# Patient Record
Sex: Female | Born: 1935 | Race: Black or African American | Hispanic: No | Marital: Married | Smoking: Never smoker
Health system: Southern US, Community
[De-identification: ages and names within clinical notes are randomized; demographics above are authoritative.]

## PROBLEM LIST (undated history)

## (undated) DIAGNOSIS — R32 Unspecified urinary incontinence: Secondary | ICD-10-CM

## (undated) DIAGNOSIS — G8110 Spastic hemiplegia affecting unspecified side: Secondary | ICD-10-CM

## (undated) DIAGNOSIS — E785 Hyperlipidemia, unspecified: Secondary | ICD-10-CM

## (undated) DIAGNOSIS — R413 Other amnesia: Secondary | ICD-10-CM

## (undated) DIAGNOSIS — M25569 Pain in unspecified knee: Secondary | ICD-10-CM

## (undated) DIAGNOSIS — D62 Acute posthemorrhagic anemia: Secondary | ICD-10-CM

## (undated) DIAGNOSIS — N390 Urinary tract infection, site not specified: Secondary | ICD-10-CM

## (undated) DIAGNOSIS — F0280 Dementia in other diseases classified elsewhere without behavioral disturbance: Secondary | ICD-10-CM

## (undated) DIAGNOSIS — D649 Anemia, unspecified: Secondary | ICD-10-CM

## (undated) DIAGNOSIS — Z9181 History of falling: Secondary | ICD-10-CM

## (undated) DIAGNOSIS — G309 Alzheimer's disease, unspecified: Secondary | ICD-10-CM

## (undated) DIAGNOSIS — I739 Peripheral vascular disease, unspecified: Secondary | ICD-10-CM

## (undated) DIAGNOSIS — I6990 Unspecified sequelae of unspecified cerebrovascular disease: Secondary | ICD-10-CM

## (undated) DIAGNOSIS — S72009B Fracture of unspecified part of neck of unspecified femur, initial encounter for open fracture type I or II: Secondary | ICD-10-CM

## (undated) DIAGNOSIS — I6529 Occlusion and stenosis of unspecified carotid artery: Secondary | ICD-10-CM

## (undated) DIAGNOSIS — M25559 Pain in unspecified hip: Secondary | ICD-10-CM

## (undated) DIAGNOSIS — I639 Cerebral infarction, unspecified: Secondary | ICD-10-CM

## (undated) DIAGNOSIS — I10 Essential (primary) hypertension: Secondary | ICD-10-CM

## (undated) DIAGNOSIS — J301 Allergic rhinitis due to pollen: Secondary | ICD-10-CM

## (undated) DIAGNOSIS — M810 Age-related osteoporosis without current pathological fracture: Secondary | ICD-10-CM

## (undated) DIAGNOSIS — K5900 Constipation, unspecified: Secondary | ICD-10-CM

## (undated) DIAGNOSIS — R410 Disorientation, unspecified: Secondary | ICD-10-CM

## (undated) DIAGNOSIS — K625 Hemorrhage of anus and rectum: Secondary | ICD-10-CM

## (undated) DIAGNOSIS — F0150 Vascular dementia without behavioral disturbance: Secondary | ICD-10-CM

## (undated) DIAGNOSIS — R269 Unspecified abnormalities of gait and mobility: Secondary | ICD-10-CM

## (undated) DIAGNOSIS — IMO0001 Reserved for inherently not codable concepts without codable children: Secondary | ICD-10-CM

## (undated) HISTORY — DX: Unspecified abnormalities of gait and mobility: R26.9

## (undated) HISTORY — DX: Pain in unspecified hip: M25.559

## (undated) HISTORY — DX: Dementia in other diseases classified elsewhere, unspecified severity, without behavioral disturbance, psychotic disturbance, mood disturbance, and anxiety: F02.80

## (undated) HISTORY — DX: Unspecified urinary incontinence: R32

## (undated) HISTORY — DX: Unspecified sequelae of unspecified cerebrovascular disease: I69.90

## (undated) HISTORY — DX: Hemorrhage of anus and rectum: K62.5

## (undated) HISTORY — DX: Fracture of unspecified part of neck of unspecified femur, initial encounter for open fracture type I or II: S72.009B

## (undated) HISTORY — DX: Spastic hemiplegia affecting unspecified side: G81.10

## (undated) HISTORY — DX: Pain in unspecified knee: M25.569

## (undated) HISTORY — DX: Age-related osteoporosis without current pathological fracture: M81.0

## (undated) HISTORY — DX: Constipation, unspecified: K59.00

## (undated) HISTORY — DX: Other amnesia: R41.3

## (undated) HISTORY — DX: Occlusion and stenosis of unspecified carotid artery: I65.29

## (undated) HISTORY — DX: Peripheral vascular disease, unspecified: I73.9

## (undated) HISTORY — PX: PARTIAL HIP ARTHROPLASTY: SHX733

## (undated) HISTORY — DX: Essential (primary) hypertension: I10

## (undated) HISTORY — DX: History of falling: Z91.81

## (undated) HISTORY — DX: Hyperlipidemia, unspecified: E78.5

## (undated) HISTORY — DX: Allergic rhinitis due to pollen: J30.1

## (undated) HISTORY — DX: Reserved for inherently not codable concepts without codable children: IMO0001

## (undated) HISTORY — DX: Vascular dementia, unspecified severity, without behavioral disturbance, psychotic disturbance, mood disturbance, and anxiety: F01.50

## (undated) HISTORY — DX: Acute posthemorrhagic anemia: D62

## (undated) HISTORY — PX: ABDOMINAL HYSTERECTOMY: SHX81

## (undated) HISTORY — DX: Urinary tract infection, site not specified: N39.0

---

## 2008-03-07 LAB — HM DEXA SCAN

## 2008-08-17 ENCOUNTER — Encounter: Admission: RE | Admit: 2008-08-17 | Discharge: 2008-08-17 | Payer: Self-pay | Admitting: Internal Medicine

## 2009-03-16 LAB — HM MAMMOGRAPHY

## 2009-04-30 ENCOUNTER — Emergency Department (HOSPITAL_COMMUNITY): Admission: EM | Admit: 2009-04-30 | Discharge: 2009-04-30 | Payer: Self-pay | Admitting: Emergency Medicine

## 2009-04-30 ENCOUNTER — Emergency Department (HOSPITAL_COMMUNITY): Admission: EM | Admit: 2009-04-30 | Discharge: 2009-04-30 | Payer: Self-pay | Admitting: Family Medicine

## 2009-10-08 ENCOUNTER — Encounter: Admission: RE | Admit: 2009-10-08 | Discharge: 2009-10-08 | Payer: Self-pay | Admitting: Internal Medicine

## 2010-11-11 ENCOUNTER — Ambulatory Visit: Payer: Medicare Other | Attending: Internal Medicine | Admitting: Physical Therapy

## 2010-11-11 DIAGNOSIS — I69998 Other sequelae following unspecified cerebrovascular disease: Secondary | ICD-10-CM | POA: Insufficient documentation

## 2010-11-11 DIAGNOSIS — R262 Difficulty in walking, not elsewhere classified: Secondary | ICD-10-CM | POA: Insufficient documentation

## 2010-11-11 DIAGNOSIS — IMO0001 Reserved for inherently not codable concepts without codable children: Secondary | ICD-10-CM | POA: Insufficient documentation

## 2011-02-07 ENCOUNTER — Emergency Department (HOSPITAL_COMMUNITY): Payer: Medicare Other

## 2011-02-07 ENCOUNTER — Inpatient Hospital Stay (HOSPITAL_COMMUNITY)
Admission: EM | Admit: 2011-02-07 | Discharge: 2011-02-11 | DRG: 470 | Disposition: A | Discharge status: Skilled Nursing Facility | Payer: Medicare Other | Attending: Orthopedic Surgery | Admitting: Orthopedic Surgery

## 2011-02-07 ENCOUNTER — Inpatient Hospital Stay (HOSPITAL_COMMUNITY): Payer: Medicare Other

## 2011-02-07 DIAGNOSIS — Z7982 Long term (current) use of aspirin: Secondary | ICD-10-CM

## 2011-02-07 DIAGNOSIS — W19XXXA Unspecified fall, initial encounter: Secondary | ICD-10-CM | POA: Diagnosis present

## 2011-02-07 DIAGNOSIS — N390 Urinary tract infection, site not specified: Secondary | ICD-10-CM | POA: Diagnosis present

## 2011-02-07 DIAGNOSIS — Z7901 Long term (current) use of anticoagulants: Secondary | ICD-10-CM

## 2011-02-07 DIAGNOSIS — I69959 Hemiplegia and hemiparesis following unspecified cerebrovascular disease affecting unspecified side: Secondary | ICD-10-CM

## 2011-02-07 DIAGNOSIS — Z7902 Long term (current) use of antithrombotics/antiplatelets: Secondary | ICD-10-CM

## 2011-02-07 DIAGNOSIS — A498 Other bacterial infections of unspecified site: Secondary | ICD-10-CM | POA: Diagnosis present

## 2011-02-07 DIAGNOSIS — S72033A Displaced midcervical fracture of unspecified femur, initial encounter for closed fracture: Principal | ICD-10-CM | POA: Diagnosis present

## 2011-02-07 DIAGNOSIS — Y9229 Other specified public building as the place of occurrence of the external cause: Secondary | ICD-10-CM

## 2011-02-07 DIAGNOSIS — E7800 Pure hypercholesterolemia, unspecified: Secondary | ICD-10-CM | POA: Diagnosis present

## 2011-02-07 DIAGNOSIS — Z8249 Family history of ischemic heart disease and other diseases of the circulatory system: Secondary | ICD-10-CM

## 2011-02-07 DIAGNOSIS — I10 Essential (primary) hypertension: Secondary | ICD-10-CM | POA: Diagnosis present

## 2011-02-07 DIAGNOSIS — F0390 Unspecified dementia without behavioral disturbance: Secondary | ICD-10-CM | POA: Diagnosis present

## 2011-02-07 LAB — URINALYSIS, ROUTINE W REFLEX MICROSCOPIC
Ketones, ur: 15 mg/dL — AB
Leukocytes, UA: NEGATIVE

## 2011-02-07 LAB — COMPREHENSIVE METABOLIC PANEL
ALT: 19 U/L (ref 0–35)
Alkaline Phosphatase: 64 U/L (ref 39–117)
BUN: 14 mg/dL (ref 6–23)
BUN: 10 mg/dL (ref 6–23)
Calcium: 9.4 mg/dL (ref 8.4–10.5)
Calcium: 9 mg/dL (ref 8.4–10.5)
Chloride: 107 mEq/L (ref 96–112)
Creatinine, Ser: 0.8 mg/dL (ref 0.50–1.10)
Creatinine, Ser: 0.7 mg/dL (ref 0.50–1.10)
GFR calc Af Amer: >60 mL/min (ref >60)
GFR calc non Af Amer: >60 mL/min (ref >60)
Glucose, Bld: 90 mg/dL (ref 70–99)
Glucose, Bld: 154 mg/dL — ABNORMAL HIGH (ref 70–99)
Sodium: 142 mEq/L (ref 135–145)
Sodium: 134 mEq/L — ABNORMAL LOW (ref 135–145)
Total Bilirubin: 0.3 mg/dL (ref 0.3–1.2)
Total Protein: 6.8 g/dL (ref 6.0–8.3)

## 2011-02-07 LAB — APTT: aPTT: 26 seconds (ref 24–37)

## 2011-02-07 LAB — DIFFERENTIAL
Lymphocytes Relative: 34 % (ref 12–46)
Lymphs Abs: 2 10*3/uL (ref 0.7–4.0)
Monocytes Absolute: 0.7 10*3/uL (ref 0.1–1.0)
Monocytes Relative: 12 % (ref 3–12)

## 2011-02-07 LAB — CBC
Hemoglobin: 12.7 g/dL (ref 12.0–15.0)
MCV: 94.4 fL (ref 78.0–100.0)
RBC: 3.9 MIL/uL (ref 3.87–5.11)
RDW: 12.5 % (ref 11.5–15.5)
WBC: 6 10*3/uL (ref 4.0–10.5)

## 2011-02-07 LAB — PROTIME-INR
INR: 1.05 (ref 0.00–1.49)
Prothrombin Time: 13.9 seconds (ref 11.6–15.2)
Prothrombin Time: 14.6 seconds (ref 11.6–15.2)

## 2011-02-07 LAB — URINE MICROSCOPIC-ADD ON

## 2011-02-07 LAB — ABO/RH: ABO/RH(D): O POS

## 2011-02-08 ENCOUNTER — Inpatient Hospital Stay (HOSPITAL_COMMUNITY): Payer: Medicare Other

## 2011-02-08 LAB — CBC
Platelets: 240 10*3/uL (ref 150–400)
RBC: 4.19 MIL/uL (ref 3.87–5.11)
RDW: 12.2 % (ref 11.5–15.5)
WBC: 13.3 10*3/uL — ABNORMAL HIGH (ref 4.0–10.5)

## 2011-02-08 LAB — SURGICAL PCR SCREEN
MRSA, PCR: NEGATIVE
Staphylococcus aureus: NEGATIVE

## 2011-02-08 LAB — BASIC METABOLIC PANEL
CO2: 23 mEq/L (ref 19–32)
Calcium: 9.2 mg/dL (ref 8.4–10.5)
Chloride: 105 mEq/L (ref 96–112)
GFR calc Af Amer: >60 mL/min (ref >60)
Sodium: 138 mEq/L (ref 135–145)

## 2011-02-09 LAB — CBC
HCT: 29.7 % — ABNORMAL LOW (ref 36.0–46.0)
MCH: 31.7 pg (ref 26.0–34.0)
MCV: 93.1 fL (ref 78.0–100.0)
Platelets: 202 10*3/uL (ref 150–400)
RBC: 3.19 MIL/uL — ABNORMAL LOW (ref 3.87–5.11)

## 2011-02-09 LAB — URINE CULTURE: Colony Count: >=100000

## 2011-02-09 LAB — BASIC METABOLIC PANEL
BUN: 13 mg/dL (ref 6–23)
CO2: 27 mEq/L (ref 19–32)
Calcium: 8.4 mg/dL (ref 8.4–10.5)
Creatinine, Ser: 0.87 mg/dL (ref 0.50–1.10)

## 2011-02-10 LAB — CBC
HCT: 24.2 % — ABNORMAL LOW (ref 36.0–46.0)
MCH: 31.5 pg (ref 26.0–34.0)
MCHC: 33.9 g/dL (ref 30.0–36.0)
MCV: 93.1 fL (ref 78.0–100.0)
Platelets: 181 10*3/uL (ref 150–400)
RDW: 12.7 % (ref 11.5–15.5)
WBC: 14.7 10*3/uL — ABNORMAL HIGH (ref 4.0–10.5)

## 2011-02-10 LAB — BASIC METABOLIC PANEL
BUN: 11 mg/dL (ref 6–23)
Calcium: 8.1 mg/dL — ABNORMAL LOW (ref 8.4–10.5)
Chloride: 106 mEq/L (ref 96–112)
Creatinine, Ser: 0.76 mg/dL (ref 0.50–1.10)
GFR calc Af Amer: >60 mL/min (ref >60)
GFR calc non Af Amer: >60 mL/min (ref >60)

## 2011-02-11 ENCOUNTER — Inpatient Hospital Stay (HOSPITAL_COMMUNITY): Payer: Medicare Other

## 2011-02-11 LAB — CBC
MCHC: 34.7 g/dL (ref 30.0–36.0)
Platelets: 217 10*3/uL (ref 150–400)
RDW: 12.7 % (ref 11.5–15.5)
WBC: 15.4 10*3/uL — ABNORMAL HIGH (ref 4.0–10.5)

## 2011-02-11 LAB — CROSSMATCH
ABO/RH(D): O POS
Antibody Screen: NEGATIVE
Unit division: 0

## 2011-02-13 NOTE — H&P (Signed)
  NAMEMarland Gray  Shirley, Gray NO.:  [id number]  MEDICAL RECORD NO.:  [id number]  LOCATION:  5001                         FACILITY:  [facility]  PHYSICIAN:  Toni Arthurs, MD        DATE OF BIRTH:  March 30, 1927  DATE OF ADMISSION:  02/07/2011 DATE OF DISCHARGE:                             HISTORY & PHYSICAL   ADMISSION DIAGNOSES: 1. Right hip femoral neck fracture. 2. History of stroke. 3. Dementia. 4. Hypercholesterolemia.  HISTORY OF PRESENT ILLNESS:  The patient is an 77 year old female who fell today while at the [facility].  She lives with her daughter normally.  She has a history of stroke and dementia.  She has never had a hip fracture in the past.  The patient and her daughter both deny recent fever, chills, nausea, vomiting, or changes in her health. She complains of dull pain in the hip at rest that becomes sharp with any attempt of motion.  It is moderate to severe pain that is relieved when she holds still.  PAST MEDICAL HISTORY: 1. Hypercholesterolemia. 2. History of carotid stenosis status post stenting. 3. History of CVA with residual right-sided hemiparesis. 4. History of hysterectomy.  SOCIAL HISTORY:  The patient lives with her daughter.  She does not use tobacco products or drinking any alcohol.  FAMILY HISTORY:  Positive for coronary artery disease in the patient's mother and stroke in the patient's father.  REVIEW OF SYSTEMS:  As above and otherwise negative.  PHYSICAL EXAMINATION:  The patient is an elderly-appearing female in no apparent distress.  She is alert and oriented x2.  Her extraocular motions are intact.  Respirations are unlabored.  She is seen lying supine on the gurney in the ER.  She has 2+ dorsalis pedis and posterior tibial pulses.  She feels to light touch throughout the right lower extremity.  There is no swelling noted.  She has pain with any attempted motion.  Strength is 5/5 with plantarflexion and dorsiflexion  of the ankles.  IMAGING:  X-rays, AP pelvis and cross-table lateral of right hip show a displaced femoral neck fracture.  ASSESSMENT:  Right femoral neck displaced fracture in an elderly patient with a history of stroke and dementia.  PLAN:  I explained to the patient and daughter the nature of this condition in detail.  We will get her scheduled for right hip hemiarthroplasty tomorrow morning.  She will be admitted to the inpatient ward and will have SCDs for DVT prophylaxis.     Toni Arthurs, MD     JH/MEDQ  D:  02/07/2011  T:  02/08/2011  Job:  [id number]  cc:   Kela Millin, M.D.  Electronically Signed by Toni Arthurs  on 02/13/2011 03:29:19 PM

## 2011-02-14 NOTE — Discharge Summary (Signed)
Shirley Gray, Shirley Gray NO.:  [id number]  MEDICAL RECORD NO.:  [id number]  LOCATION:  5001                         FACILITY:  [facility]  PHYSICIAN:  Madlyn Frankel. Charlann Boxer, M.D.  DATE OF BIRTH:  [birth year]  DATE OF ADMISSION:  02/07/2011 DATE OF DISCHARGE:  02/11/2011                        DISCHARGE SUMMARY - REFERRING   PROCEDURES:  Right hip hemiarthroplasty.  ADMITTING DIAGNOSIS:  Right hip femoral neck fracture.  DISCHARGE DIAGNOSES: 1. Status post right hip hemiarthroplasty. 2. History of stroke. 3. Dementia. 4. Hypercholesterolemia.  HISTORY OF PRESENT ILLNESS:  The patient is an 77 year old female who fell on February 07, 2011, [facility].  She lives with her daughter normally.  She has a history of stroke and dementia. She has never had a hip fracture in the past.  The patient and her daughter both deny fevers, chills, nausea, vomiting, or changes in her health.  She originally complains of dull pain in the hip area at rest becoming sharp with any motion.  It is only relieved by not moving the hip.  The patient initially presented to the ER.  The patient was seen by Dr. Victorino Dike.  Dr. Victorino Dike talked to Dr. Charlann Boxer.  The risks, benefits, and expectations of procedure were discussed with the patient and the patient's family.  The patient and the patient's family understand risks, benefits, and expectations and wished to proceed with surgery.  HOSPITAL COURSE:  The patient underwent the above stated procedure on February 08, 2011.  The patient tolerated the procedure and was brought to the recovery room in good condition, subsequently to the floor.  On postop day #1, February 09, 2011, the patient is up in chair with minimal pain.  Hemoglobin was 10.1.  Dressing was dry.  The patient does have right-sided weakness, which is chronic secondary to stroke.  The patient is afebrile, vital signs stable.  On postop day #2, February 10, 2011, the  patient was only complaining of slight discomfort, afebrile, vital signs stable.  Hematocrit was 24.2.  The patient's dressing was changed.  The patient was up with PT, weightbearing as tolerated.  On postop day #9, February 11, 2011, the patient was doing well.  The patient's H and H were 7.6/21.9, stable hemoglobin, drop in hematocrit. She was asymptomatic without any symptoms.  Otherwise, the patient was doing well, afebrile.  The patient had a temperature of 101.0.  The patient has been put on Cipro for UTI.  Dressing good, clean, dry, and intact.  The patient had physical therapy and occupational therapy.  She will be discharged to SNF today.  DISCHARGE CONDITION:  Good.  DISCHARGE INSTRUCTIONS:  The patient will be discharged to the skilled nursing facility today.  The patient will have daily dressing changes on the area.  The patient will keep the area dry and clean until followup. The patient will follow up with Dr. Charlann Boxer in 2 weeks.  The patient will be monitored at skilled nursing facility.  The patient is to continue Cipro for 10 days for possible UTI.  The patient's family knows to call with any questions or concerns.  DISCHARGE MEDICATIONS: 1. Cipro 500 one p.o. b.i.d. x10 days. 2. Benadryl 25  mg 1 p.o. q.4 h. p.r.n. 3. Colace 100 mg 1 p.o. b.i.d. constipation. 4. Lovenox 40 mg subcu q.24 h. x10 days. 5. Iron sulfate 325 mg 1 p.o. t.i.d. x2-3 weeks. 6. Norco 5/325 one to two p.o. q.4-6 h. p.r.n. pain. 7. Robaxin 500 mg 1 p.o. q.6 h. p.r.n. muscle spasms. 8. Aspirin enteric coated 81 mg 1 p.o. q.a.m. 9. Evista 60 mg 1 p.o. q.a.m. 10.MiraLax 17 g p.o. daily p.r.n. constipation. 11.Plavix 75 mg 1 p.o. q.a.m. 12.Simvastatin 40 mg 1 p.o. q.a.m. 13.VESIcare 5 mg 1 p.o. q.a.m.    ______________________________ Lanney Gins, PA   ______________________________ Madlyn Frankel. Charlann Boxer, M.D.    MB/MEDQ  D:  02/11/2011  T:  02/11/2011  Job:  [id number]  Electronically  Signed by Lanney Gins PA on 02/11/2011 03:59:08 PM Electronically Signed by Durene Romans M.D. on 02/14/2011 07:25:38 AM

## 2011-02-14 NOTE — Op Note (Signed)
Shirley Gray, MURTAGH NO.:  [id number]  MEDICAL RECORD NO.:  [id number]  LOCATION:  5001                         FACILITY:  [facility]  PHYSICIAN:  Madlyn Frankel. Charlann Boxer, M.D.  DATE OF BIRTH:  1926/12/18  DATE OF PROCEDURE:  02/08/2011 DATE OF DISCHARGE:                              OPERATIVE REPORT   PREOPERATIVE DIAGNOSIS:  Displaced right femoral neck fracture.  POSTOPERATIVE DIAGNOSIS:  Displaced right femoral neck fracture.  PROCEDURE:  Right hip hemiarthroplasty utilizing a DePuy Tri-Lock system with a size 8 standard Tri-Lock stem, a 50-mm unipolar ball and a 0 adapter.  SURGEON:  Madlyn Frankel. Charlann Boxer, MD  ASSISTANT:  Toni Arthurs, MD  ANESTHESIA:  General.  BLOOD LOSS:  100 mL.  DRAINS:  None.  COMPLICATIONS:  None.  SPECIMEN:  None.  INDICATIONS FOR PROCEDURE:  Ms. Shirley Gray is an 77 year old female with some history of mild dementia as well as a history of a right-sided stroke with more effect on the right upper extremity than the lower. She does live apparently independently as reported by the family and gets around with a walker.  She has to focus on the leg being locked down on the right side to bear weight, apparently this was not done when she fell [facility] a [facility] when she was visiting.  Radiographs revealed displaced femoral neck fracture.  She was admitted by Dr. Toni Arthurs, cleared by Medicine.  Risks and benefits of the procedure were discussed and reviewed for right hip hemiarthroplasty. Consent was obtained.  PROCEDURE IN DETAIL:  The patient was brought to operative theater. Once adequate anesthesia and preoperative antibiotics were administered, the patient was positioned in the left lateral decubitus position with right side up, and the right lower extremity was then prepped and draped in a sterile fashion.  A time-out was performed identifying the patient, planned procedure, and extremity.  Lateral incision was made based  off the proximal trochanter.  Sharp dissection was carried to the iliotibial band and gluteal fascia.  The posterior aspect the hip was then exposed.  Short external rotators were taken down and separated from the posterior capsule and an L capsulotomy made.  The hip was internally rotated.  Fracture identified.  Based on the nature of the fracture pattern, an osteotomy was made in trochanteric fossa.  Femoral head was removed, measured on the back table to be 50 mm in diameter.  Proximal femur was then exposed and then opened with a drill and hand reamed once, then irrigated to try to prevent fat emboli.  I began to broach with 0 broach and broached up to a size 8 broach with good medial and lateral metaphyseal fit.  Trial reduction was carried out with a standard neck and a 0 adapter.  With this, the hip was stable, leg lengths appeared to be equal.  Given there was no evidence of impingement, I chose the 8 standard as my final stem.  Trial components were then removed.  The final 8 standard stem was then impacted down to the neck where the neck cut had been made.  Based on this and the trial reduction, a 0 adapter was chosen, impacted into the 50-mm unipolar ball  and the two impacted onto clean and dry trunnion. The hip was then reduced.  The hip had been irrigated throughout the case.  Again, at this point, I did reapproximate posterior capsule using #1 Vicryl.  No Hemovac drain was used.  The iliotibial band and gluteal fascia were then reapproximated using #1 Vicryl.  The remainder of the wound was closed with a 2-0 Vicryl and running 4-0 Monocryl.  The hip was cleaned, dried, and dressed sterilely using Steri-Strips and Mepilex dressing.  The patient was then brought to the recovery room in stable condition tolerating the procedure well.     Madlyn Frankel Charlann Boxer, M.D.     MDO/MEDQ  D:  02/08/2011  T:  02/08/2011  Job:  [id number]  Electronically Signed by Durene Romans M.D.  on 02/14/2011 07:25:34 AM

## 2011-02-17 NOTE — Op Note (Signed)
  NAMESHANTAVIA, JHA NO.:  [id number]  MEDICAL RECORD NO.:  [id number]  LOCATION:  5001                         FACILITY:  [facility]  PHYSICIAN:  Madlyn Frankel. Charlann Boxer, M.D.  DATE OF BIRTH:  [birth year]  DATE OF PROCEDURE:  02/08/2011 DATE OF DISCHARGE:  02/11/2011                              OPERATIVE REPORT   ADDENDUM:  Physician's assistant was utilized throughout this entire case for preoperative positioning, perioperative positioning, leg retractor assistance as well as general facilitation of the case.  Wound closure assistance was utilized.  Physician's assistant was presents for the entire case.     Madlyn Frankel Charlann Boxer, M.D.     MDO/MEDQ  D:  02/14/2011  T:  02/14/2011  Job:  [id number]  Electronically Signed by Durene Romans M.D. on 02/17/2011 09:43:38 AM

## 2011-06-16 DIAGNOSIS — D62 Acute posthemorrhagic anemia: Secondary | ICD-10-CM | POA: Diagnosis not present

## 2011-06-16 DIAGNOSIS — R269 Unspecified abnormalities of gait and mobility: Secondary | ICD-10-CM | POA: Diagnosis not present

## 2011-06-16 DIAGNOSIS — K5900 Constipation, unspecified: Secondary | ICD-10-CM | POA: Diagnosis not present

## 2011-06-16 DIAGNOSIS — S72019A Unspecified intracapsular fracture of unspecified femur, initial encounter for closed fracture: Secondary | ICD-10-CM | POA: Diagnosis not present

## 2011-06-17 ENCOUNTER — Emergency Department (HOSPITAL_COMMUNITY)
Admission: EM | Admit: 2011-06-17 | Discharge: 2011-06-17 | Disposition: A | Discharge status: Home / Self Care | Payer: Medicare Other | Attending: Emergency Medicine | Admitting: Emergency Medicine

## 2011-06-17 ENCOUNTER — Emergency Department (HOSPITAL_COMMUNITY): Payer: Medicare Other

## 2011-06-17 ENCOUNTER — Encounter (HOSPITAL_COMMUNITY): Payer: Self-pay | Admitting: *Deleted

## 2011-06-17 DIAGNOSIS — Z8673 Personal history of transient ischemic attack (TIA), and cerebral infarction without residual deficits: Secondary | ICD-10-CM | POA: Insufficient documentation

## 2011-06-17 DIAGNOSIS — M25559 Pain in unspecified hip: Secondary | ICD-10-CM | POA: Diagnosis not present

## 2011-06-17 DIAGNOSIS — E785 Hyperlipidemia, unspecified: Secondary | ICD-10-CM | POA: Insufficient documentation

## 2011-06-17 DIAGNOSIS — Z7982 Long term (current) use of aspirin: Secondary | ICD-10-CM | POA: Insufficient documentation

## 2011-06-17 DIAGNOSIS — S298XXA Other specified injuries of thorax, initial encounter: Secondary | ICD-10-CM | POA: Diagnosis not present

## 2011-06-17 DIAGNOSIS — M25551 Pain in right hip: Secondary | ICD-10-CM

## 2011-06-17 DIAGNOSIS — Z79899 Other long term (current) drug therapy: Secondary | ICD-10-CM | POA: Diagnosis not present

## 2011-06-17 DIAGNOSIS — F29 Unspecified psychosis not due to a substance or known physiological condition: Secondary | ICD-10-CM | POA: Diagnosis not present

## 2011-06-17 DIAGNOSIS — W07XXXA Fall from chair, initial encounter: Secondary | ICD-10-CM | POA: Insufficient documentation

## 2011-06-17 HISTORY — DX: Disorientation, unspecified: R41.0

## 2011-06-17 HISTORY — DX: Hyperlipidemia, unspecified: E78.5

## 2011-06-17 HISTORY — DX: Cerebral infarction, unspecified: I63.9

## 2011-06-17 LAB — BASIC METABOLIC PANEL
BUN: 11 mg/dL (ref 6–23)
Chloride: 105 mEq/L (ref 96–112)
GFR calc Af Amer: 76 mL/min — ABNORMAL LOW (ref >90)
GFR calc non Af Amer: 66 mL/min — ABNORMAL LOW (ref >90)
Glucose, Bld: 105 mg/dL — ABNORMAL HIGH (ref 70–99)
Potassium: 3.5 mEq/L (ref 3.5–5.1)
Sodium: 140 mEq/L (ref 135–145)

## 2011-06-17 LAB — CBC
Hemoglobin: 12.6 g/dL (ref 12.0–15.0)
MCH: 31.5 pg (ref 26.0–34.0)
Platelets: 252 10*3/uL (ref 150–400)
RBC: 4 MIL/uL (ref 3.87–5.11)
WBC: 8.9 10*3/uL (ref 4.0–10.5)

## 2011-06-17 LAB — DIFFERENTIAL
Eosinophils Absolute: 0 10*3/uL (ref 0.0–0.7)
Lymphs Abs: 3.3 10*3/uL (ref 0.7–4.0)
Monocytes Relative: 12 % (ref 3–12)
Neutro Abs: 4.5 10*3/uL (ref 1.7–7.7)
Neutrophils Relative %: 51 % (ref 43–77)

## 2011-06-17 MED ORDER — ONDANSETRON HCL 4 MG/2ML IJ SOLN
4.0000 mg | Freq: Once | INTRAMUSCULAR | Status: AC
  Administered 2011-06-17: 4 mg via INTRAVENOUS
  Filled 2011-06-17: qty 2

## 2011-06-17 MED ORDER — MORPHINE SULFATE 4 MG/ML IJ SOLN
4.0000 mg | Freq: Once | INTRAMUSCULAR | Status: AC
  Administered 2011-06-17: 4 mg via INTRAVENOUS
  Filled 2011-06-17: qty 1

## 2011-06-17 MED ORDER — SODIUM CHLORIDE 0.9 % IV SOLN
INTRAVENOUS | Status: DC
  Administered 2011-06-17: 13:00:00 via INTRAVENOUS

## 2011-06-17 MED ORDER — HYDROMORPHONE HCL PF 1 MG/ML IJ SOLN
1.0000 mg | Freq: Once | INTRAMUSCULAR | Status: AC
  Administered 2011-06-17: 1 mg via INTRAVENOUS
  Filled 2011-06-17: qty 1

## 2011-06-17 NOTE — ED Notes (Signed)
Patient reported to be walking inside,  [place] on the outside steps.  Family assisted patient inside.  She continued to have pain in her right hip.  EMS called for eval.  Patient has hx of hip surgery on same side 4 mths ago.  No other complaints.  Denies loc

## 2011-06-17 NOTE — ED Notes (Signed)
This Clinical research associate spoke w/veronica xrt informing her pt was ready for transport

## 2011-06-17 NOTE — ED Notes (Signed)
Dr. Earmon Phoenix at bedside.

## 2011-06-17 NOTE — ED Notes (Signed)
Pt and family stated that she was walking and fell on her rt side around 8 am. Family stated that pt has rt side weakness from previous CVA and R hip replacement. Pt stated that she just "lost her footing" and fell. Pt stated that she did not have any dizziness, CP, SOB anytime before or after. Currently not in any respiratory distress. Complaining of rt hip pain being 7 out of 10. Medication given. Will continue to monitor.

## 2011-06-17 NOTE — ED Notes (Addendum)
Per daughter at bedside, pt fell approx 0800 this am, pt fell in her bedroom landing on her R side, pt c/o R hip pain, increase pain after sitting for some time. Pt holding her R hip c/o severe pain, describes the pain as constant throbbing pain a 10/10.

## 2011-06-17 NOTE — ED Notes (Signed)
Patient resting.  Family with reported incont of urine. Perineal care provided.  Small amount of urine noted.  Patient skin intact.   No noted breakdown noted.  Family remains at bedside.  Leg positioned for comfort.

## 2011-06-17 NOTE — ED Notes (Signed)
Report given to Ryan RN. No questions or concerns.  

## 2011-06-17 NOTE — ED Provider Notes (Signed)
History     CSN: [id number]  Arrival date & time 06/17/11  1205   First MD Initiated Contact with Patient 06/17/11 1208      Chief Complaint  Patient presents with  . Fall  . Hip Pain    (Consider location/radiation/quality/duration/timing/severity/associated sxs/prior treatment) Patient is a 78 y.o. female presenting with fall and hip pain. The history is provided by the patient and a relative.  Fall Pertinent negatives include no fever, no abdominal pain, no nausea, no vomiting and no headaches.  Hip Pain Pertinent negatives include no chest pain, no abdominal pain, no headaches and no shortness of breath.   the patient is an 78 year old, female, with a history of right hip replacement.  She was trying to sit down, and she fell onto her right hip.  She complains of right hip pain.  She denies hitting her head.  She denies pain anywhere except in her right hip.  She denies recent illnesses.  She has not had nausea, vomiting, fevers, chills, cough, shortness of breath, diarrhea, or urinary tract symptoms.  Past Medical History  Diagnosis Date  . Stroke   . Hyperlipidemia   . Confusion     Past Surgical History  Procedure Date  . Partial hip arthroplasty   . Abdominal hysterectomy     No family history on file.  History  Substance Use Topics  . Smoking status: Never Smoker   . Smokeless tobacco: Not on file  . Alcohol Use: No    OB History    Grav Para Term Preterm Abortions TAB SAB Ect Mult Living                  Review of Systems  Constitutional: Negative for fever and chills.  HENT: Negative for congestion.   Eyes: Negative for visual disturbance.  Respiratory: Negative for cough and shortness of breath.   Cardiovascular: Negative for chest pain and palpitations.  Gastrointestinal: Negative for nausea, vomiting, abdominal pain and diarrhea.  Genitourinary: Negative for dysuria.  Musculoskeletal: Negative for back pain.       Right hip pain  Skin:  Negative for wound.  Neurological: Negative for headaches.  Psychiatric/Behavioral: Negative for confusion.  All other systems reviewed and are negative.    Allergies  Review of patient's allergies indicates no known allergies.  Home Medications   Current Outpatient Rx  Name Route Sig Dispense Refill  . ASPIRIN EC 81 MG PO TBEC Oral Take 81 mg by mouth daily.    Marland Kitchen CALTRATE 600 PLUS-VIT D PO Oral Take 1 tablet by mouth daily.    Marland Kitchen CLOPIDOGREL BISULFATE 75 MG PO TABS Oral Take 75 mg by mouth daily.    Marland Kitchen DIPHENHYDRAMINE HCL 25 MG PO CAPS Oral Take 25-50 mg by mouth every 6 (six) hours as needed. For allergies    . ADULT MULTIVITAMIN W/MINERALS CH Oral Take 1 tablet by mouth daily.    Marland Kitchen OVER THE COUNTER MEDICATION Oral Take 1 tablet by mouth daily before lunch. Iron tablet    . RALOXIFENE HCL 60 MG PO TABS Oral Take 60 mg by mouth daily.    Marland Kitchen SIMVASTATIN 10 MG PO TABS Oral Take 40 mg by mouth at bedtime.     . SOLIFENACIN SUCCINATE 5 MG PO TABS Oral Take 5 mg by mouth daily.       BP 171/102  Pulse 94  Temp(Src) 98.1 F (36.7 C) (Oral)  Resp 22  SpO2 100%  Physical Exam  Constitutional: She is  oriented to person, place, and time.       Frail elderly lady, in moderate distress due to pain.  HENT:  Head: Normocephalic and atraumatic.  Eyes: Pupils are equal, round, and reactive to light.  Neck: Normal range of motion.  Cardiovascular: Normal rate, regular rhythm and normal heart sounds.   No murmur heard. Pulmonary/Chest: Effort normal and breath sounds normal. No respiratory distress. She has no wheezes. She has no rales.  Abdominal: Soft. She exhibits no distension and no mass. There is no tenderness. There is no rebound and no guarding.  Musculoskeletal:       Right hip is flexed and internally rotated. \ Tenderness over right hip Femur, knee, lower leg and foot on the right side are normal.  Neurological: She is alert and oriented to person, place, and time. No cranial  nerve deficit.  Skin: Skin is warm and dry. No rash noted. No erythema.  Psychiatric: She has a normal mood and affect. Her behavior is normal.    ED Course  Procedures (including critical care time) 78 year old, female, with a history of right hip replacement, presents with right hip pain after she fell from a chair.  We will perform an x-ray of her hip and chest and do laboratory testing.  Due to concern for hip fracture.   Labs Reviewed  DIFFERENTIAL - Abnormal; Notable for the following:    Monocytes Absolute 1.1 (*)    All other components within normal limits  BASIC METABOLIC PANEL - Abnormal; Notable for the following:    Glucose, Bld 105 (*)    GFR calc non Af Amer 66 (*)    GFR calc Af Amer 76 (*)    All other components within normal limits  CBC   Dg Chest 1 View  06/17/2011  *RADIOLOGY REPORT*  Clinical Data: Fall, confusion  CHEST - 1 VIEW  Comparison: 02/11/2011  Findings: Retrocardiac scarring or atelectasis again noted.  Heart size upper limits of normal. Aorta is ectatic and unfolded.  Lung fields are otherwise clear the exception of a previously seen left upper lobe probable granuloma.  No pleural effusion.  Degenerative change or potentially post-traumatic deformity is noted involving the left acromion.  IMPRESSION: No acute cardiopulmonary process. If the patient's symptoms continue, consider PA and lateral chest radiographs obtained at full inspiration when the patient is clinically able.  Original Report Authenticated By: Harrel Lemon, M.D.   Dg Hip Complete Right  06/17/2011  *RADIOLOGY REPORT*  Clinical Data: Pain post fall  RIGHT HIP - COMPLETE 2+ VIEW  Comparison: 02/08/2011  Findings: Three views of the right hip submitted.  Again noted right hip hemiarthroplasty in anatomic alignment.  No acute fracture or subluxation.  IMPRESSION: No acute fracture or subluxation.  Stable postoperative changes.  Original Report Authenticated By: Natasha Mead, M.D.     No  diagnosis found.  No fracture.  Patient is able to move her hip more easily.  However, she is not able to straighten it out completely due to persistent pain.  We will give her more morphine then reevaluate her  4:04 PM Still not able to straighten out right leg completely.  Will give dilaudid and move to cdu. [facility]. Pt walks with 4 leg cane.  Spoke  With Felicie Morn.  He will monitor and reassess  MDM  Right hip pain following a fall.  No fracture or dislocation No signs of acute illness, which would have precipitated a fall to  Nicholes Stairs, MD 06/17/11 [phone]

## 2011-06-17 NOTE — ED Notes (Addendum)
Family at bedside. Daughters; Shirley Gray and Shirley Gray

## 2011-06-17 NOTE — ED Provider Notes (Signed)
Patient feeling better after pain medications.  Is able to ambulate with assistance with minimal difficulty (at baseline for patient).  Patient discharged home in care of family.  Patient to follow-up with her PCP.  Jimmye Norman, NP 06/17/11 1942

## 2011-06-18 NOTE — ED Provider Notes (Signed)
Medical screening examination/treatment/procedure(s) were conducted as a shared visit with non-physician practitioner(s) and myself.  I personally evaluated the patient during the encounter  Dunya Meiners P Myrtha Tonkovich, MD 06/18/11 1114 

## 2011-07-09 DIAGNOSIS — Z1231 Encounter for screening mammogram for malignant neoplasm of breast: Secondary | ICD-10-CM | POA: Diagnosis not present

## 2011-09-15 DIAGNOSIS — IMO0001 Reserved for inherently not codable concepts without codable children: Secondary | ICD-10-CM | POA: Diagnosis not present

## 2011-09-15 DIAGNOSIS — I6990 Unspecified sequelae of unspecified cerebrovascular disease: Secondary | ICD-10-CM | POA: Diagnosis not present

## 2011-09-15 DIAGNOSIS — M810 Age-related osteoporosis without current pathological fracture: Secondary | ICD-10-CM | POA: Diagnosis not present

## 2011-09-15 DIAGNOSIS — I10 Essential (primary) hypertension: Secondary | ICD-10-CM | POA: Diagnosis not present

## 2012-02-11 DIAGNOSIS — L84 Corns and callosities: Secondary | ICD-10-CM | POA: Diagnosis not present

## 2012-02-11 DIAGNOSIS — M2040 Other hammer toe(s) (acquired), unspecified foot: Secondary | ICD-10-CM | POA: Diagnosis not present

## 2012-03-15 DIAGNOSIS — I10 Essential (primary) hypertension: Secondary | ICD-10-CM | POA: Diagnosis not present

## 2012-03-15 DIAGNOSIS — F0280 Dementia in other diseases classified elsewhere without behavioral disturbance: Secondary | ICD-10-CM | POA: Diagnosis not present

## 2012-03-15 DIAGNOSIS — D62 Acute posthemorrhagic anemia: Secondary | ICD-10-CM | POA: Diagnosis not present

## 2012-03-15 DIAGNOSIS — M810 Age-related osteoporosis without current pathological fracture: Secondary | ICD-10-CM | POA: Diagnosis not present

## 2012-03-15 DIAGNOSIS — IMO0001 Reserved for inherently not codable concepts without codable children: Secondary | ICD-10-CM | POA: Diagnosis not present

## 2012-03-15 DIAGNOSIS — Z23 Encounter for immunization: Secondary | ICD-10-CM | POA: Diagnosis not present

## 2012-03-15 DIAGNOSIS — R269 Unspecified abnormalities of gait and mobility: Secondary | ICD-10-CM | POA: Diagnosis not present

## 2012-06-15 DIAGNOSIS — N390 Urinary tract infection, site not specified: Secondary | ICD-10-CM | POA: Diagnosis not present

## 2012-06-15 DIAGNOSIS — I10 Essential (primary) hypertension: Secondary | ICD-10-CM | POA: Diagnosis not present

## 2012-06-30 ENCOUNTER — Encounter (HOSPITAL_COMMUNITY): Payer: Self-pay | Admitting: Family Medicine

## 2012-06-30 ENCOUNTER — Emergency Department (HOSPITAL_COMMUNITY)
Admission: EM | Admit: 2012-06-30 | Discharge: 2012-06-30 | Disposition: A | Discharge status: Home / Self Care | Payer: Medicare Other | Attending: Emergency Medicine | Admitting: Emergency Medicine

## 2012-06-30 DIAGNOSIS — Z7902 Long term (current) use of antithrombotics/antiplatelets: Secondary | ICD-10-CM | POA: Insufficient documentation

## 2012-06-30 DIAGNOSIS — I10 Essential (primary) hypertension: Secondary | ICD-10-CM | POA: Insufficient documentation

## 2012-06-30 DIAGNOSIS — K625 Hemorrhage of anus and rectum: Secondary | ICD-10-CM

## 2012-06-30 DIAGNOSIS — Z8673 Personal history of transient ischemic attack (TIA), and cerebral infarction without residual deficits: Secondary | ICD-10-CM | POA: Insufficient documentation

## 2012-06-30 DIAGNOSIS — Z862 Personal history of diseases of the blood and blood-forming organs and certain disorders involving the immune mechanism: Secondary | ICD-10-CM | POA: Diagnosis not present

## 2012-06-30 DIAGNOSIS — Z8659 Personal history of other mental and behavioral disorders: Secondary | ICD-10-CM | POA: Insufficient documentation

## 2012-06-30 DIAGNOSIS — K921 Melena: Secondary | ICD-10-CM | POA: Diagnosis not present

## 2012-06-30 DIAGNOSIS — G309 Alzheimer's disease, unspecified: Secondary | ICD-10-CM | POA: Insufficient documentation

## 2012-06-30 DIAGNOSIS — Z79899 Other long term (current) drug therapy: Secondary | ICD-10-CM | POA: Insufficient documentation

## 2012-06-30 DIAGNOSIS — F0280 Dementia in other diseases classified elsewhere without behavioral disturbance: Secondary | ICD-10-CM | POA: Diagnosis not present

## 2012-06-30 DIAGNOSIS — E785 Hyperlipidemia, unspecified: Secondary | ICD-10-CM | POA: Diagnosis not present

## 2012-06-30 DIAGNOSIS — Z7982 Long term (current) use of aspirin: Secondary | ICD-10-CM | POA: Diagnosis not present

## 2012-06-30 HISTORY — DX: Dementia in other diseases classified elsewhere, unspecified severity, without behavioral disturbance, psychotic disturbance, mood disturbance, and anxiety: F02.80

## 2012-06-30 HISTORY — DX: Essential (primary) hypertension: I10

## 2012-06-30 HISTORY — DX: Anemia, unspecified: D64.9

## 2012-06-30 HISTORY — DX: Alzheimer's disease, unspecified: G30.9

## 2012-06-30 LAB — COMPREHENSIVE METABOLIC PANEL
ALT: 16 U/L (ref 0–35)
AST: 26 U/L (ref 0–37)
Calcium: 9.7 mg/dL (ref 8.4–10.5)
GFR calc Af Amer: 87 mL/min — ABNORMAL LOW (ref >90)
Sodium: 142 mEq/L (ref 135–145)
Total Protein: 7.3 g/dL (ref 6.0–8.3)

## 2012-06-30 LAB — TYPE AND SCREEN: ABO/RH(D): O POS

## 2012-06-30 LAB — CBC
HCT: 35.5 % — ABNORMAL LOW (ref 36.0–46.0)
Hemoglobin: 12.1 g/dL (ref 12.0–15.0)
MCHC: 34.1 g/dL (ref 30.0–36.0)

## 2012-06-30 LAB — PROTIME-INR: INR: 1.07 (ref 0.00–1.49)

## 2012-06-30 MED ORDER — ACETAMINOPHEN 325 MG PO TABS
650.0000 mg | ORAL_TABLET | Freq: Once | ORAL | Status: AC
  Administered 2012-06-30: 650 mg via ORAL
  Filled 2012-06-30: qty 2

## 2012-06-30 NOTE — ED Notes (Signed)
Family states that the patient began GI bleeding at about 0600 this AM.  States that she has been bleeding all day.  States that she has been passing little blood clots.  No report of nausea, vomiting or diarrhea. Patient passing flatus which smells like melena.

## 2012-06-30 NOTE — ED Provider Notes (Signed)
History     CSN: [id number]  Arrival date & time 06/30/12  1422   First MD Initiated Contact with Patient 06/30/12 2023      Chief Complaint  Patient presents with  . Rectal Bleeding    (Consider location/radiation/quality/duration/timing/severity/associated sxs/prior treatment) HPI Comments: 79 y/o F p/w rectal bleed. Onset this AM. Patient and family noticed some blood clots. Went to PCP who directed to ED for further mgmt. No abdominal pain. No fevers. Otherwise feeling well.  Patient is a 79 y.o. female presenting with hematochezia. The history is provided by the patient and a relative.  Rectal Bleeding  The current episode started today. The problem occurs continuously. The problem has been unchanged. The pain is moderate. The stool is described as soft. There was no prior successful therapy. There was no prior unsuccessful therapy. Pertinent negatives include no fever, no abdominal pain, no diarrhea, no nausea, no rectal pain, no vomiting, no hematuria, no chest pain, no headaches, no coughing, no difficulty breathing and no rash. She has been behaving normally. She has been eating and drinking normally.    Past Medical History  Diagnosis Date  . Stroke   . Hyperlipidemia   . Confusion   . Alzheimer's dementia   . Hypertension   . Anemia     Past Surgical History  Procedure Date  . Partial hip arthroplasty   . Abdominal hysterectomy     History reviewed. No pertinent family history.  History  Substance Use Topics  . Smoking status: Never Smoker   . Smokeless tobacco: Not on file  . Alcohol Use: No    OB History    Grav Para Term Preterm Abortions TAB SAB Ect Mult Living                  Review of Systems  Constitutional: Negative for fever and chills.  HENT: Negative for congestion and rhinorrhea.   Respiratory: Negative for cough and shortness of breath.   Cardiovascular: Negative for chest pain and leg swelling.  Gastrointestinal: Positive for blood  in stool and hematochezia. Negative for nausea, vomiting, abdominal pain, diarrhea and rectal pain.  Genitourinary: Negative for dysuria, hematuria and flank pain.  Musculoskeletal: Negative for back pain.  Skin: Negative for color change and rash.  Neurological: Negative for dizziness and headaches.  All other systems reviewed and are negative.    Allergies  Review of patient's allergies indicates no known allergies.  Home Medications   Current Outpatient Rx  Name  Route  Sig  Dispense  Refill  . ACETAMINOPHEN 325 MG PO TABS   Oral   Take 325 mg by mouth every 6 (six) hours as needed. For pain         . ASPIRIN EC 81 MG PO TBEC   Oral   Take 81 mg by mouth daily.         Marland Kitchen CALCIUM-VITAMIN D 600-400 MG-UNIT PO TABS   Oral   Take 1 tablet by mouth daily.         Marland Kitchen CLOPIDOGREL BISULFATE 75 MG PO TABS   Oral   Take 75 mg by mouth daily.         Marland Kitchen ENSURE PO LIQD   Oral   Take 237 mLs by mouth 3 (three) times daily between meals.         Marland Kitchen MELATONIN 1 MG PO TABS   Oral   Take 2 tablets by mouth at bedtime.          Marland Kitchen  ADULT MULTIVITAMIN W/MINERALS CH   Oral   Take 1 tablet by mouth daily.         Marland Kitchen POLYETHYLENE GLYCOL 3350 PO PACK   Oral   Take 17 g by mouth daily as needed. For constipation         . RALOXIFENE HCL 60 MG PO TABS   Oral   Take 60 mg by mouth daily.         Marland Kitchen SIMVASTATIN 40 MG PO TABS   Oral   Take 40 mg by mouth every evening.         Marland Kitchen SOLIFENACIN SUCCINATE 5 MG PO TABS   Oral   Take 5 mg by mouth daily.          Marland Kitchen PREPARATION H EX   Apply externally   Apply 1 application topically daily.           BP 159/77  Pulse 78  Temp 97.7 F (36.5 C)  Resp 20  Ht 5\' 6"  (1.676 m)  Wt 116 lb (52.617 kg)  BMI 18.72 kg/m2  SpO2 100%  Physical Exam  Nursing note and vitals reviewed. Constitutional: She appears well-developed and well-nourished. No distress.  HENT:  Head: Normocephalic and atraumatic.  Eyes:  Conjunctivae normal are normal. Right eye exhibits no discharge. Left eye exhibits no discharge.  Neck: No tracheal deviation present.  Cardiovascular: Normal rate, regular rhythm, normal heart sounds and intact distal pulses.   Pulmonary/Chest: Effort normal and breath sounds normal. No stridor. No respiratory distress. She has no wheezes. She has no rales.  Abdominal: Soft. She exhibits no distension. There is no tenderness. There is no guarding.  Genitourinary: Rectal exam shows tenderness. Rectal exam shows no external hemorrhoid and no fissure. Guaiac positive stool (gross blood on exam. not actively bleeding externally).  Musculoskeletal: She exhibits no edema and no tenderness.  Neurological: She is alert.  Skin: Skin is warm and dry.    ED Course  Procedures (including critical care time)  Labs Reviewed  COMPREHENSIVE METABOLIC PANEL - Abnormal; Notable for the following:    Potassium 3.4 (*)     GFR calc non Af Amer 75 (*)     GFR calc Af Amer 87 (*)     All other components within normal limits  CBC - Abnormal; Notable for the following:    RBC 3.71 (*)     HCT 35.5 (*)     All other components within normal limits  TYPE AND SCREEN  PROTIME-INR   No results found.   1. Rectal bleed       MDM   79 y/o F with multiple PMH on plavix and asa p/w rectal bleeding. H/o hemorrhoids. Bleeding today only. No abdominal pain. No fevers. hgb from about 2 weeks ago of 12. hgb today 12.1. HDS. Not tachycardic. mentating at baseline. Ambulatory around unit. Patient stable for outpt workup of rectal bleeding. To f/u pcp within one week. Patient discharged home. Return precautions given. To follow up with pcp. family in agreement with plan.  Just prior to discharge, patient given tylenol for chronic right hip pain. Baseline.  Labs and imaging reviewed by myself and considered in medical decision making if ordered. Imaging interpreted by radiology.   Discussed case with Dr.  Deretha Emory who is in agreement with assessment and plan.          Stevie Kern, MD 07/01/12 [phone]

## 2012-06-30 NOTE — ED Notes (Signed)
Per family pt had foul smelling stool this am with clots of blood. Sent here by doctor.

## 2012-07-01 DIAGNOSIS — D62 Acute posthemorrhagic anemia: Secondary | ICD-10-CM | POA: Diagnosis not present

## 2012-07-01 DIAGNOSIS — I10 Essential (primary) hypertension: Secondary | ICD-10-CM | POA: Diagnosis not present

## 2012-07-01 DIAGNOSIS — F0280 Dementia in other diseases classified elsewhere without behavioral disturbance: Secondary | ICD-10-CM | POA: Diagnosis not present

## 2012-07-01 DIAGNOSIS — K625 Hemorrhage of anus and rectum: Secondary | ICD-10-CM | POA: Diagnosis not present

## 2012-07-02 NOTE — ED Provider Notes (Signed)
I saw and evaluated the patient, reviewed the resident's note and I agree with the findings and plan.  Patient seen by me, elderly patient, new onset of rectal bleeding today. Work up in ED, patient stable, no tachycardia or hypotension, Hgb stable at 12, no change from 2 weeks ago. Patient can be sent home with return precautions, and follow up for colonoscopy. No abdominal pain.   Results for orders placed during the hospital encounter of 06/30/12  TYPE AND SCREEN      Component Value Range   ABO/RH(D) O POS     Antibody Screen NEG     Sample Expiration 07/03/2012    COMPREHENSIVE METABOLIC PANEL      Component Value Range   Sodium 142  135 - 145 mEq/L   Potassium 3.4 (*) 3.5 - 5.1 mEq/L   Chloride 106  96 - 112 mEq/L   CO2 28  19 - 32 mEq/L   Glucose, Bld 86  70 - 99 mg/dL   BUN 16  6 - 23 mg/dL   Creatinine, Ser 5.78  0.50 - 1.10 mg/dL   Calcium 9.7  8.4 - 46.9 mg/dL   Total Protein 7.3  6.0 - 8.3 g/dL   Albumin 4.0  3.5 - 5.2 g/dL   AST 26  0 - 37 U/L   ALT 16  0 - 35 U/L   Alkaline Phosphatase 48  39 - 117 U/L   Total Bilirubin 0.4  0.3 - 1.2 mg/dL   GFR calc non Af Amer 75 (*) >90 mL/min   GFR calc Af Amer 87 (*) >90 mL/min  CBC      Component Value Range   WBC 6.4  4.0 - 10.5 K/uL   RBC 3.71 (*) 3.87 - 5.11 MIL/uL   Hemoglobin 12.1  12.0 - 15.0 g/dL   HCT 62.9 (*) 52.8 - 41.3 %   MCV 95.7  78.0 - 100.0 fL   MCH 32.6  26.0 - 34.0 pg   MCHC 34.1  30.0 - 36.0 g/dL   RDW 24.4  01.0 - 27.2 %   Platelets 265  150 - 400 K/uL  PROTIME-INR      Component Value Range   Prothrombin Time 13.8  11.6 - 15.2 seconds   INR 1.07  0.00 - 1.49     Shelda Jakes, MD 07/02/12 1431

## 2012-07-12 DIAGNOSIS — M810 Age-related osteoporosis without current pathological fracture: Secondary | ICD-10-CM | POA: Diagnosis not present

## 2012-07-12 DIAGNOSIS — Z1231 Encounter for screening mammogram for malignant neoplasm of breast: Secondary | ICD-10-CM | POA: Diagnosis not present

## 2012-07-12 DIAGNOSIS — Z803 Family history of malignant neoplasm of breast: Secondary | ICD-10-CM | POA: Diagnosis not present

## 2012-08-30 DIAGNOSIS — Q828 Other specified congenital malformations of skin: Secondary | ICD-10-CM | POA: Diagnosis not present

## 2012-08-30 DIAGNOSIS — B351 Tinea unguium: Secondary | ICD-10-CM | POA: Diagnosis not present

## 2012-08-30 DIAGNOSIS — M79609 Pain in unspecified limb: Secondary | ICD-10-CM | POA: Diagnosis not present

## 2012-09-17 ENCOUNTER — Encounter: Payer: Self-pay | Admitting: *Deleted

## 2012-09-20 ENCOUNTER — Ambulatory Visit (INDEPENDENT_AMBULATORY_CARE_PROVIDER_SITE_OTHER): Payer: Medicare Other | Admitting: Internal Medicine

## 2012-09-20 ENCOUNTER — Encounter: Payer: Self-pay | Admitting: Internal Medicine

## 2012-09-20 VITALS — BP 122/80 | HR 60 | Resp 16 | Wt 119.2 lb

## 2012-09-20 DIAGNOSIS — F0151 Vascular dementia with behavioral disturbance: Secondary | ICD-10-CM | POA: Insufficient documentation

## 2012-09-20 DIAGNOSIS — F0150 Vascular dementia without behavioral disturbance: Secondary | ICD-10-CM

## 2012-09-20 DIAGNOSIS — M810 Age-related osteoporosis without current pathological fracture: Secondary | ICD-10-CM | POA: Diagnosis not present

## 2012-09-20 DIAGNOSIS — G4700 Insomnia, unspecified: Secondary | ICD-10-CM

## 2012-09-20 DIAGNOSIS — R32 Unspecified urinary incontinence: Secondary | ICD-10-CM | POA: Insufficient documentation

## 2012-09-20 DIAGNOSIS — D509 Iron deficiency anemia, unspecified: Secondary | ICD-10-CM | POA: Diagnosis not present

## 2012-09-20 DIAGNOSIS — F01518 Vascular dementia, unspecified severity, with other behavioral disturbance: Secondary | ICD-10-CM | POA: Insufficient documentation

## 2012-09-20 NOTE — Patient Instructions (Signed)
Stop vesicare for now and see if incontinence gets worse.  Continue off aspirin.  Will check blood counts and electrolytes today.  Overall, you are doing very well.  I don't want to put you on sleeping pills that may make you confused or cause you to fall.

## 2012-09-20 NOTE — Progress Notes (Signed)
Subjective:     Patient ID: Shirley Gray, female   DOB: 02-26-1927, 79 y.o.   MRN: [id number]  HPI Still not sleeping some nights.  If tired, she is out cold.  Otherwise gets up in the middle of the night for the next day--gets washed and dressed.  Walks all day through the house.  Balance has been good.  No falls.  Does sometimes forget to put her brace on.  Appetite good.  Some days doesn't want to eat.  Loves to eat, she says.  Mood is good for most part.  Wants to go outside and will have tantrum about not being able to go out.  Daughter keeps watch on her.  Goes out with family and then gets tired easily.  Does have urinary incontinence.  Vesicare not helpful per daughter.  See ROS and a/p notes for remainder of details,     Review of Systems  Constitutional: Negative for activity change, appetite change, fatigue and unexpected weight change.  HENT: Negative for congestion.   Eyes: Negative for visual disturbance.  Respiratory: Negative for cough, chest tightness and shortness of breath.   Cardiovascular: Negative for chest pain, palpitations and leg swelling.  Gastrointestinal: Negative for abdominal pain and anal bleeding.  Endocrine: Negative for polyphagia.  Genitourinary: Negative for dysuria and difficulty urinating.       Urinary incontinence  Allergic/Immunologic: Negative for environmental allergies.  Neurological: Positive for weakness. Negative for dizziness.       Right hemiparesis with RUE contracture present  Hematological: Negative for adenopathy.  Psychiatric/Behavioral: Positive for confusion. Negative for behavioral problems and agitation.       Objective:   Physical Exam  Constitutional:  Frail AA female, NAD  HENT:  Head: Normocephalic and atraumatic.  Eyes: Pupils are equal, round, and reactive to light.  Cardiovascular: Normal rate, regular rhythm, normal heart sounds and intact distal pulses.   Pulmonary/Chest: Effort normal and breath sounds normal.   Abdominal: Soft. Bowel sounds are normal.  Musculoskeletal: She exhibits no edema.  Neurological: She is alert.  Oriented to person and place, not specific time Right hemiparesis with RUE contracture s/p stroke  Skin: Skin is warm and dry.  Psychiatric: She has a normal mood and affect.       Assessment:    Vascular dementia Has been stable.  No recent progression in memory loss or functional decline.  Senile osteoporosis Continue calcium with D and evista.  Latest bone density remains consistent with osteoporosis.    Urinary incontinence Is on vesicare for her incontinence, but daughters do not note any benefit from it.  We agreed to try her off of it, and see if there is a change.    Insomnia Seems related to dementia and age-related changes in sleep cycle.  Advised it was a bad idea to take sedative-hypnotics due to fall risk and worsening of cognition, constipation, and risks of urinary retention.  Advised to "wear her out" in the daytime with physical activity and mental stimulation.    Anemia, iron deficiency F/u cbc due to her previous GI bleeding.  Continue off asa.  Continue plavix alone.  No recent GI bleeding difficulties.   Labs:  Cbc, bmp today.

## 2012-09-20 NOTE — Assessment & Plan Note (Signed)
F/u cbc due to her previous GI bleeding.  Continue off asa.  Continue plavix alone.  No recent GI bleeding difficulties.

## 2012-09-20 NOTE — Assessment & Plan Note (Signed)
Is on vesicare for her incontinence, but daughters do not note any benefit from it.  We agreed to try her off of it, and see if there is a change.

## 2012-09-20 NOTE — Assessment & Plan Note (Signed)
Continue calcium with D and evista.  Latest bone density remains consistent with osteoporosis.

## 2012-09-20 NOTE — Assessment & Plan Note (Signed)
Seems related to dementia and age-related changes in sleep cycle.  Advised it was a bad idea to take sedative-hypnotics due to fall risk and worsening of cognition, constipation, and risks of urinary retention.  Advised to "wear her out" in the daytime with physical activity and mental stimulation.

## 2012-09-20 NOTE — Assessment & Plan Note (Signed)
Has been stable.  No recent progression in memory loss or functional decline.

## 2012-09-21 LAB — CBC WITH DIFFERENTIAL/PLATELET
Basophils Absolute: 0 10*3/uL (ref 0.0–0.2)
Basos: 1 % (ref 0–3)
Eos: 1 % (ref 0–5)
Eosinophils Absolute: 0 10*3/uL (ref 0.0–0.4)
HCT: 35.7 % (ref 34.0–46.6)
Hemoglobin: 11.8 g/dL (ref 11.1–15.9)
Immature Grans (Abs): 0 10*3/uL (ref 0.0–0.1)
Immature Granulocytes: 0 % (ref 0–2)
Lymphocytes Absolute: 1.4 10*3/uL (ref 0.7–3.1)
Lymphs: 36 % (ref 14–46)
MCH: 31.8 pg (ref 26.6–33.0)
MCHC: 33.1 g/dL (ref 31.5–35.7)
MCV: 96 fL (ref 79–97)
Monocytes Absolute: 0.5 10*3/uL (ref 0.1–0.9)
Monocytes: 13 % — ABNORMAL HIGH (ref 4–12)
Neutrophils Absolute: 1.8 10*3/uL (ref 1.4–7.0)
Neutrophils Relative %: 49 % (ref 40–74)
RBC: 3.71 x10E6/uL — ABNORMAL LOW (ref 3.77–5.28)
RDW: 12.7 % (ref 12.3–15.4)
WBC: 3.7 10*3/uL (ref 3.4–10.8)

## 2012-09-21 LAB — BASIC METABOLIC PANEL
BUN/Creatinine Ratio: 17 (ref 11–26)
BUN: 15 mg/dL (ref 8–27)
CO2: 24 mmol/L (ref 19–28)
Calcium: 9.2 mg/dL (ref 8.6–10.2)
Chloride: 105 mmol/L (ref 97–108)
Creatinine, Ser: 0.9 mg/dL (ref 0.57–1.00)
GFR calc Af Amer: 68 mL/min/{1.73_m2} (ref >59)
GFR calc non Af Amer: 59 mL/min/{1.73_m2} — ABNORMAL LOW (ref >59)
Glucose: 87 mg/dL (ref 65–99)
Potassium: 3.9 mmol/L (ref 3.5–5.2)
Sodium: 141 mmol/L (ref 134–144)

## 2012-09-27 ENCOUNTER — Other Ambulatory Visit: Payer: Self-pay | Admitting: Geriatric Medicine

## 2012-09-27 MED ORDER — CLOPIDOGREL BISULFATE 75 MG PO TABS: 75.0000 mg | ORAL_TABLET | Freq: Every day | ORAL | Status: DC

## 2012-09-27 MED ORDER — RALOXIFENE HCL 60 MG PO TABS: 60.0000 mg | ORAL_TABLET | Freq: Every day | ORAL | Status: DC

## 2012-10-21 ENCOUNTER — Other Ambulatory Visit: Payer: Self-pay | Admitting: *Deleted

## 2012-10-21 MED ORDER — RALOXIFENE HCL 60 MG PO TABS: ORAL_TABLET | ORAL | Status: DC

## 2012-10-21 MED ORDER — CLOPIDOGREL BISULFATE 75 MG PO TABS: 75.0000 mg | ORAL_TABLET | Freq: Every day | ORAL | Status: DC

## 2013-01-04 ENCOUNTER — Other Ambulatory Visit: Payer: Self-pay | Admitting: Internal Medicine

## 2013-01-05 ENCOUNTER — Other Ambulatory Visit: Payer: Self-pay | Admitting: Geriatric Medicine

## 2013-01-05 MED ORDER — SIMVASTATIN 40 MG PO TABS: ORAL_TABLET | ORAL | Status: DC

## 2013-01-20 ENCOUNTER — Ambulatory Visit (INDEPENDENT_AMBULATORY_CARE_PROVIDER_SITE_OTHER): Payer: Medicare Other | Admitting: Internal Medicine

## 2013-01-20 ENCOUNTER — Encounter: Payer: Self-pay | Admitting: Internal Medicine

## 2013-01-20 VITALS — BP 118/66 | HR 86 | Temp 97.6°F | Resp 13 | Ht 66.0 in | Wt 109.2 lb

## 2013-01-20 DIAGNOSIS — D509 Iron deficiency anemia, unspecified: Secondary | ICD-10-CM | POA: Diagnosis not present

## 2013-01-20 DIAGNOSIS — R627 Adult failure to thrive: Secondary | ICD-10-CM | POA: Diagnosis not present

## 2013-01-20 DIAGNOSIS — I69991 Dysphagia following unspecified cerebrovascular disease: Secondary | ICD-10-CM

## 2013-01-20 DIAGNOSIS — I69320 Aphasia following cerebral infarction: Secondary | ICD-10-CM

## 2013-01-20 DIAGNOSIS — M810 Age-related osteoporosis without current pathological fracture: Secondary | ICD-10-CM

## 2013-01-20 DIAGNOSIS — G4700 Insomnia, unspecified: Secondary | ICD-10-CM

## 2013-01-20 DIAGNOSIS — I69920 Aphasia following unspecified cerebrovascular disease: Secondary | ICD-10-CM

## 2013-01-20 DIAGNOSIS — F0150 Vascular dementia without behavioral disturbance: Secondary | ICD-10-CM | POA: Diagnosis not present

## 2013-01-20 DIAGNOSIS — R32 Unspecified urinary incontinence: Secondary | ICD-10-CM

## 2013-01-20 MED ORDER — MIRTAZAPINE 7.5 MG PO TABS: 7.5000 mg | ORAL_TABLET | Freq: Every day | ORAL | Status: DC

## 2013-01-20 MED ORDER — SIMVASTATIN 40 MG PO TABS: ORAL_TABLET | ORAL | Status: DC

## 2013-01-20 NOTE — Progress Notes (Signed)
Patient ID: Shirley Gray, female   DOB: 11/02/1926, 79 y.o.   MRN: [id number] Location:  [facility]  Code Status: DNR  No Known Allergies  Chief Complaint  Patient presents with  . Medical Managment of Chronic Issues    HPI: Patient is a 79 y.o. pleasant black female seen in the office today for med mgt of chronic diseases.  She has had a prior stroke with vascular dementia.    Daughter notes decreased appetite with weight loss.  Drinking ensure.  Eats ok, but does not gain any weight.  Sometimes c/o stomach pain (did not today).  No notable correlation.  Had that one prior episode of melena/hematochezia several mos ago that resolved.  Is too frail to tolerate cscope.    Does not sleep well and melatonin not helpful.    Speech has declined.  Had difficulty spitting out her words.  Threw out her teeth so harder to understand also.  Cries sometimes and talks about going home all of the time.  Her daughter thinks she wants to go back to her home growing up.      Right shoulder--humerus comes out of socket since stroke so using sling.    Discussed possibility of antipsychotic, but refused at this time.    Review of Systems:  Review of Systems  Constitutional: Positive for weight loss. Negative for malaise/fatigue.  Eyes: Negative for blurred vision.  Respiratory: Negative for shortness of breath.   Cardiovascular: Negative for chest pain.  Genitourinary: Negative for dysuria.  Neurological: Negative for dizziness.     Past Medical History  Diagnosis Date  . Stroke   . Hyperlipidemia   . Confusion   . Alzheimer's dementia   . Hypertension   . Anemia   . Vascular dementia, uncomplicated   . Acute posthemorrhagic anemia   . Pain in joint, pelvic region and thigh   . Open fracture of unspecified part of neck of femur   . Pain in joint, pelvic region and thigh   . Unspecified constipation   . Urinary tract infection, site not  specified   . Hemorrhage of rectum and anus   . Abnormality of gait   . Memory loss   . Dementia in conditions classified elsewhere without behavioral disturbance(294.10)   . Myalgia and myositis, unspecified   . Personal history of fall   . Other and unspecified hyperlipidemia   . Spastic hemiplegia affecting dominant side   . Peripheral vascular disease, unspecified   . Pain in joint, lower leg   . Osteoporosis, unspecified   . Unspecified urinary incontinence   . Unspecified essential hypertension   . Occlusion and stenosis of carotid artery without mention of cerebral infarction   . Unspecified late effects of cerebrovascular disease   . Allergic rhinitis due to pollen     Past Surgical History  Procedure Laterality Date  . Partial hip arthroplasty    . Abdominal hysterectomy      Social History:   reports that she has never smoked. She does not have any smokeless tobacco history on file. She reports that she does not drink alcohol or use illicit drugs.  No family history on file.  Medications: Patient's Medications  New Prescriptions   No medications on file  Previous Medications   ACETAMINOPHEN (TYLENOL) 325 MG TABLET    Take 325 mg by mouth every 6 (six) hours as needed. For pain   CALCIUM CARB-CHOLECALCIFEROL (CALCIUM-VITAMIN D) 600-400 MG-UNIT TABS  Take 1 tablet by mouth daily.   CLOPIDOGREL (PLAVIX) 75 MG TABLET    Take 1 tablet (75 mg total) by mouth daily.   ENSURE (ENSURE)    Take 237 mLs by mouth 3 (three) times daily between meals.   MELATONIN 1 MG TABS    Take 2 tablets by mouth at bedtime.    MULTIPLE VITAMIN (MULITIVITAMIN WITH MINERALS) TABS    Take 1 tablet by mouth daily.   POLYETHYLENE GLYCOL (MIRALAX / GLYCOLAX) PACKET    Take 17 g by mouth daily as needed. For constipation   RALOXIFENE (EVISTA) 60 MG TABLET    Take 1 tablet (60 mg total) by mouth daily.   RALOXIFENE (EVISTA) 60 MG TABLET    Take one tablet once a day for bones   WITCH HAZEL  (PREPARATION H EX)    Apply 1 application topically daily.  Modified Medications   Modified Medication Previous Medication   SIMVASTATIN (ZOCOR) 40 MG TABLET simvastatin (ZOCOR) 40 MG tablet      Take one tablet by mouth every day for cholesterol    Take one tablet by mouth every day for cholesterol  Discontinued Medications   SOLIFENACIN (VESICARE) 5 MG TABLET    Take 5 mg by mouth daily.     Physical Exam: Filed Vitals:   01/20/13 0949  BP: 118/66  Pulse: 86  Temp: 97.6 F (36.4 C)  TempSrc: Oral  Resp: 13  Height: 5\' 6"  (1.676 m)  Weight: 109 lb 3.2 oz (49.533 kg)  Physical Exam  Constitutional:  Frail AA female seated in wheelchair, has right hemiparesis with right arm in sling  HENT:  Head: Normocephalic and atraumatic.  Edentulous and dentures not in (pt threw them out accidentally)  Eyes: EOM are normal. Pupils are equal, round, and reactive to light.  Cardiovascular: Normal rate, regular rhythm, normal heart sounds and intact distal pulses.   Pulmonary/Chest: Effort normal and breath sounds normal. No respiratory distress.  Abdominal: Soft. Bowel sounds are normal. She exhibits no distension. There is no tenderness.  Neurological: She is alert.  Oriented to person only, speech very difficult to understand, drooling a little bit  Skin: Skin is warm and dry.   Labs reviewed: Basic Metabolic Panel:  Recent Labs  09/81/19 1450 09/20/12 0857  NA 142 141  K 3.4* 3.9  CL 106 105  CO2 28 24  GLUCOSE 86 87  BUN 16 15  CREATININE 0.79 0.90  CALCIUM 9.7 9.2   Liver Function Tests:  Recent Labs  06/30/12 1450  AST 26  ALT 16  ALKPHOS 48  BILITOT 0.4  PROT 7.3  ALBUMIN 4.0  CBC:  Recent Labs  06/30/12 1450 09/20/12 0857  WBC 6.4 3.7  NEUTROABS  --  1.8  HGB 12.1 11.8  HCT 35.5* 35.7  MCV 95.7 96  PLT 265  --    Assessment/Plan 1. Failure to thrive in adult -continues to progress--is losing more weight despite eating fairly well - begin  mirtazapine (REMERON) 7.5 MG tablet; Take 1 tablet (7.5 mg total) by mouth at bedtime.  Dispense: 30 tablet; Refill: 3 to help with appetite and sleep as well as mood (having tearful episodes when has psychosis and talks with her deceased family)  2. Vascular dementia -has progressed--it appears to me that she has had another stroke that has affected her speech and swallowing some (also not helped by the fact that she threw out her dentures) -continues to have some preserved function--occasionally will self-toilet  and change her depends if they are wet, does sometimes dress and fix herself up, feeds herself -increased psychosis recently with seeing deceased relatives, talking with them and crying, also talking to TV  3. Anemia, iron deficiency -has been stable (in fact improved on last labs) with no recent evidence of GI bleeding, is off asa, but continues on plavix due to stroke risk  4. Insomnia -will try remeron for this--seems this should help with several ongoing concerns her daughter has with her at this point  5. Senile osteoporosis -continues on evista and ca with D which is hard for her to swallow -unfortunately, she cannot take the chewable ca with D either b/c she no longer has dentures  6. Urinary incontinence -unchanged, uses depends  7. Dysphagia as late effect of cerebrovascular disease -new difficulty mostly with thin liquids and her daughter has been using thickener to help;  Also has some difficulty with large pills -there is no desire for tube feeding in her goals of care  8. Aphasia as late effect of cerebrovascular accident -seems to be new with dysarthria (not exclusively from lack of dentures)  Next appt:  3 mos

## 2013-03-15 ENCOUNTER — Ambulatory Visit (INDEPENDENT_AMBULATORY_CARE_PROVIDER_SITE_OTHER): Payer: Medicare Other

## 2013-03-15 DIAGNOSIS — Z23 Encounter for immunization: Secondary | ICD-10-CM

## 2013-03-21 ENCOUNTER — Ambulatory Visit (INDEPENDENT_AMBULATORY_CARE_PROVIDER_SITE_OTHER): Payer: Medicare Other | Admitting: Podiatry

## 2013-03-21 ENCOUNTER — Encounter: Payer: Self-pay | Admitting: Podiatry

## 2013-03-21 VITALS — BP 137/76 | HR 81 | Resp 16

## 2013-03-21 DIAGNOSIS — Q828 Other specified congenital malformations of skin: Secondary | ICD-10-CM

## 2013-03-21 NOTE — Progress Notes (Signed)
°  Subjective:    Patient ID: Shirley Gray, female    DOB: 06-Feb-1927, 79 y.o.   MRN: [id number]  HPI trim on 2nd toe and 5th toe on left    Review of Systems  Constitutional: Negative.   HENT: Negative.   Eyes:       Wears glasses  Respiratory: Negative.   Cardiovascular: Negative.   Gastrointestinal: Negative.   Endocrine: Negative.   Genitourinary: Negative.   Musculoskeletal: Negative.   Skin: Negative.   Hematological: Bruises/bleeds easily.  Psychiatric/Behavioral:       Altheizmers       Objective:   Physical Exam        Assessment & Plan:

## 2013-03-22 NOTE — Progress Notes (Signed)
Subjective:     Patient ID: Shirley Gray, female   DOB: 1926/08/08, 79 y.o.   MRN: [id number]  HPI patient is elderly female with corns on the second and fifth toe that are painful for her to wear shoe gear with or put pressure on   Review of Systems     Objective:   Physical Exam Circulatory status diminished but intact from previous visit. Corn formation second and fifth toe left painful when pressed    Assessment:     Digital deformity creating keratotic lesion formation    Plan:     Debridement of lesions on the left foot no iatrogenic bleeding noted

## 2013-04-12 ENCOUNTER — Other Ambulatory Visit: Payer: Self-pay | Admitting: Family Medicine

## 2013-04-12 ENCOUNTER — Ambulatory Visit: Payer: Medicare Other

## 2013-04-12 ENCOUNTER — Ambulatory Visit (INDEPENDENT_AMBULATORY_CARE_PROVIDER_SITE_OTHER): Payer: Medicare Other | Admitting: Family Medicine

## 2013-04-12 VITALS — BP 142/70 | HR 90 | Temp 97.7°F | Resp 16 | Ht 66.0 in | Wt 110.0 lb

## 2013-04-12 DIAGNOSIS — R51 Headache: Secondary | ICD-10-CM | POA: Diagnosis not present

## 2013-04-12 DIAGNOSIS — M542 Cervicalgia: Secondary | ICD-10-CM

## 2013-04-12 DIAGNOSIS — D7589 Other specified diseases of blood and blood-forming organs: Secondary | ICD-10-CM | POA: Diagnosis not present

## 2013-04-12 LAB — POCT CBC
Lymph, poc: 1.8 (ref 0.6–3.4)
MCH, POC: 31 pg (ref 27–31.2)
MCHC: 30.2 g/dL — AB (ref 31.8–35.4)
MID (cbc): 0.4 (ref 0–0.9)
MPV: 8.9 fL (ref 0–99.8)
POC Granulocyte: 4.3 (ref 2–6.9)
POC LYMPH PERCENT: 27.6 %L (ref 10–50)
POC MID %: 5.5 %M (ref 0–12)
RDW, POC: 12.6 %
WBC: 6.5 10*3/uL (ref 4.6–10.2)

## 2013-04-12 LAB — COMPREHENSIVE METABOLIC PANEL
ALT: 19 U/L (ref 0–35)
Alkaline Phosphatase: 54 U/L (ref 39–117)
Creat: 0.63 mg/dL (ref 0.50–1.10)
Sodium: 141 mEq/L (ref 135–145)
Total Bilirubin: 0.6 mg/dL (ref 0.3–1.2)
Total Protein: 6.7 g/dL (ref 6.0–8.3)

## 2013-04-12 LAB — POCT RAPID STREP A (OFFICE): Rapid Strep A Screen: NEGATIVE

## 2013-04-12 NOTE — Progress Notes (Signed)
[facility] [address] [place] [address] [phone]  Date:  04/12/2013   Name:  Shirley Gray   DOB:  19-Jan-1927   MRN:  [id number]  PCP:  Bufford Spikes, DO    Chief Complaint: Jaw Pain, Headache and Neck Pain   History of Present Illness:  Shirley Gray is a 79 y.o. very pleasant female patient who presents with the following:  History of multiple medical problems including stroke, right carotid stent, dementia..  She has seemed to have pain in her head and neck for a couple of days.   Here today with her family.  They have not noted any cough or fever.   She has been rubbing the area and "making ugly faces."   Otherwise she has been a bit quiter than usual but no other major changes.   She is eating well- normally.  She uses a cane for walking around the house and is in her [facility] today for this longer outing.   She had a stroke in 2004 and has residual right sided paralysis.  She is speaking less over the last few months.  She also has lost her dentures which makes communication more difficult.   Her family has noted this change gradually over the last few months. There has been no acute change At this point she will occasionally say a word that can be understood, but mostly they figure out her meaning by the context and her gestures.    Patient Active Problem List   Diagnosis Date Noted  . Vascular dementia 09/20/2012  . Insomnia 09/20/2012  . Anemia, iron deficiency 09/20/2012  . Senile osteoporosis 09/20/2012  . Urinary incontinence 09/20/2012    Past Medical History  Diagnosis Date  . Stroke   . Hyperlipidemia   . Confusion   . Alzheimer's dementia   . Hypertension   . Anemia   . Vascular dementia, uncomplicated   . Acute posthemorrhagic anemia   . Pain in joint, pelvic region and thigh   . Open fracture of unspecified part of neck of femur   . Pain in joint, pelvic region and thigh   . Unspecified constipation   . Urinary  tract infection, site not specified   . Hemorrhage of rectum and anus   . Abnormality of gait   . Memory loss   . Dementia in conditions classified elsewhere without behavioral disturbance(294.10)   . Myalgia and myositis, unspecified   . Personal history of fall   . Other and unspecified hyperlipidemia   . Spastic hemiplegia affecting dominant side   . Peripheral vascular disease, unspecified   . Pain in joint, lower leg   . Osteoporosis, unspecified   . Unspecified urinary incontinence   . Unspecified essential hypertension   . Occlusion and stenosis of carotid artery without mention of cerebral infarction   . Unspecified late effects of cerebrovascular disease   . Allergic rhinitis due to pollen     Past Surgical History  Procedure Laterality Date  . Partial hip arthroplasty    . Abdominal hysterectomy      History  Substance Use Topics  . Smoking status: Never Smoker   . Smokeless tobacco: Never Used  . Alcohol Use: No    History reviewed. No pertinent family history.  No Known Allergies  Medication list has been reviewed and updated.  Current Outpatient Prescriptions on File Prior to Visit  Medication Sig Dispense Refill  . acetaminophen (TYLENOL) 325 MG tablet Take 325  mg by mouth every 6 (six) hours as needed. For pain      . Calcium Carb-Cholecalciferol (CALCIUM-VITAMIN D) 600-400 MG-UNIT TABS Take 1 tablet by mouth daily.      . clopidogrel (PLAVIX) 75 MG tablet Take 1 tablet (75 mg total) by mouth daily.  90 tablet  3  . ENSURE (ENSURE) Take 237 mLs by mouth 3 (three) times daily between meals.      . mirtazapine (REMERON) 7.5 MG tablet Take 1 tablet (7.5 mg total) by mouth at bedtime.  30 tablet  3  . Multiple Vitamin (MULITIVITAMIN WITH MINERALS) TABS Take 1 tablet by mouth daily.      . polyethylene glycol (MIRALAX / GLYCOLAX) packet Take 17 g by mouth daily as needed. For constipation      . raloxifene (EVISTA) 60 MG tablet Take 1 tablet (60 mg total) by  mouth daily.  30 tablet  3  . raloxifene (EVISTA) 60 MG tablet Take one tablet once a day for bones  90 tablet  3  . simvastatin (ZOCOR) 40 MG tablet Take one tablet by mouth every day for cholesterol  90 tablet  3  . Witch Hazel (PREPARATION H EX) Apply 1 application topically daily.      . Melatonin 1 MG TABS Take 2 tablets by mouth at bedtime.        No current facility-administered medications on file prior to visit.    Review of Systems:  As per HPI- otherwise negative.   Physical Examination: Filed Vitals:   04/12/13 1027  BP: 163/75  Pulse: 90  Temp: 97.7 F (36.5 C)  Resp: 16   Filed Vitals:   04/12/13 1027  Height: 5\' 6"  (1.676 m)  Weight: 110 lb (49.896 kg)   Body mass index is 17.76 kg/(m^2). Ideal Body Weight: Weight in (lb) to have BMI = 25: 154.6  GEN: WDWN, NAD, Non-toxic, Alert and oriented to self.  Sitting upright unassisted on exam table.  Does not speak in a way that I can understand, but will follow commands HEENT: Atraumatic, Normocephalic. Neck supple. No masses, No LAD.  Bilateral TM wnl, oropharynx normal.  PEERL,EOMI.  Not wearing her teeth.  No apparent source of pain in her neck, throat or head.  No rash, no tenderness over the temporal arteries.  No tonsillar enlargement or exudate.  She does not move her neck well but I cannot determine if this is due to pain or simply her resistance.   Ears and Nose: No external deformity. CV: RRR, No M/G/R. No JVD. No thrill. No extra heart sounds. PULM: CTA B, no wheezes, crackles, rhonchi. No retractions. No resp. distress. No accessory muscle use. EXTR: No c/c/e NEURO slow gait, needs assistance.   PSYCH: appears calm, comfortable and well- cared for Right upper extremity contracture and hemiparesis  Results for orders placed in visit on 04/12/13  POCT CBC      Result Value Range   WBC 6.5  4.6 - 10.2 K/uL   Lymph, poc 1.8  0.6 - 3.4   POC LYMPH PERCENT 27.6  10 - 50 %L   MID (cbc) 0.4  0 - 0.9   POC  MID % 5.5  0 - 12 %M   POC Granulocyte 4.3  2 - 6.9   Granulocyte percent 66.9  37 - 80 %G   RBC 4.10  4.04 - 5.48 M/uL   Hemoglobin 12.7  12.2 - 16.2 g/dL   HCT, POC 45.4  09.8 - 47.9 %  MCV 102.5 (*) 80 - 97 fL   MCH, POC 31.0  27 - 31.2 pg   MCHC 30.2 (*) 31.8 - 35.4 g/dL   RDW, POC 16.1     Platelet Count, POC 236  142 - 424 K/uL   MPV 8.9  0 - 99.8 fL  POCT RAPID STREP A (OFFICE)      Result Value Range   Rapid Strep A Screen Negative  Negative   [facility] reading (PRIMARY) by  Dr. Patsy Lager. Cervical spine films: significant degenerative change  CERVICAL SPINE - 2-3 VIEW  COMPARISON: None.  FINDINGS: No fracture is noted. Degenerative disc disease is noted at C3-4 and C6-7. Grade 1 anterolisthesis of C7-T1 is noted most likely due to degenerative disc disease at this level. Right-sided carotid stent is noted. Degenerative changes seen involving several posterior facet joints.  IMPRESSION: Degenerative disc disease is noted at C3-4, C6-7 and C7-T1. No acute abnormality seen in the cervical spine.  Assessment and Plan: Neck pain - Plan: POCT CBC, Comprehensive metabolic panel, POCT rapid strep A, DG Cervical Spine 2 or 3 views  Headache(784.0)  Yina is here today with two of her daughters who are noted that she seems to indicate discomfort in her neck/ throat and head for the last few days.  Her evaluation is limited by her lack of communication, history of dementia and CVA.  Explained to her family that I cannot definitively determine the cause of her pain here in clinic.  Her labs and VS are reassuring.  Suspect that she has a benign cause of pain such as her neck arthritis, but other causes such as recurrent stroke or even meningitis cannot be completely ruled out. I am happy to arrange any further evaluation they might like such as imaging of her head or ED evaluation.  Discussed the need for family and caregivers to consider not only the benefits but also the harms that  may be caused by performing invasive testing on a debilitated elderly person. At this time they elect to take her home and try tylenol for her discomfort.  If she has any concerning changes in her condition they will call or seek care.  They plan to follow-up with her PCP this week.    Signed Abbe Amsterdam, MD

## 2013-04-12 NOTE — Patient Instructions (Signed)
It was to see your family today. If Shirley Gray has any acute change in her condition or if you have any other concerns please let me know.   Please try tylenol and sore throat spray as needed.

## 2013-04-13 ENCOUNTER — Encounter: Payer: Self-pay | Admitting: Family Medicine

## 2013-04-13 LAB — VITAMIN B12: Vitamin B-12: 1543 pg/mL — ABNORMAL HIGH (ref 211–911)

## 2013-04-14 ENCOUNTER — Ambulatory Visit (INDEPENDENT_AMBULATORY_CARE_PROVIDER_SITE_OTHER): Payer: Medicare Other | Admitting: Internal Medicine

## 2013-04-14 ENCOUNTER — Encounter: Payer: Self-pay | Admitting: Internal Medicine

## 2013-04-14 VITALS — BP 130/80 | HR 61 | Temp 97.4°F | Resp 18 | Ht 66.0 in | Wt 110.4 lb

## 2013-04-14 DIAGNOSIS — D509 Iron deficiency anemia, unspecified: Secondary | ICD-10-CM

## 2013-04-14 DIAGNOSIS — G4700 Insomnia, unspecified: Secondary | ICD-10-CM | POA: Diagnosis not present

## 2013-04-14 DIAGNOSIS — F0152 Vascular dementia, unspecified severity, with psychotic disturbance: Secondary | ICD-10-CM | POA: Diagnosis not present

## 2013-04-14 DIAGNOSIS — F22 Delusional disorders: Secondary | ICD-10-CM | POA: Diagnosis not present

## 2013-04-14 DIAGNOSIS — R32 Unspecified urinary incontinence: Secondary | ICD-10-CM | POA: Diagnosis not present

## 2013-04-14 DIAGNOSIS — R627 Adult failure to thrive: Secondary | ICD-10-CM | POA: Diagnosis not present

## 2013-04-14 DIAGNOSIS — M47812 Spondylosis without myelopathy or radiculopathy, cervical region: Secondary | ICD-10-CM

## 2013-04-14 DIAGNOSIS — F0151 Vascular dementia with behavioral disturbance: Secondary | ICD-10-CM | POA: Diagnosis not present

## 2013-04-14 MED ORDER — SIMVASTATIN 40 MG PO TABS: ORAL_TABLET | ORAL | Status: DC

## 2013-04-14 NOTE — Progress Notes (Signed)
Patient ID: Shirley Gray, female   DOB: 07-Feb-1927, 79 y.o.   MRN: [id number] Location:  [facility]  Code Status: DNR   No Known Allergies  Chief Complaint  Patient presents with  . Follow-up    urgent care visit-neck pain swollen glands    HPI: Patient is a 79 y.o. AA female seen in the office today for follow up after visiting urgent care for swollen glands. Family states that the patient is still c/o of pain. At urgent care she was advised to use tylenol for discomfort. Family states that the tylenol might help a little but patient is still complaining about it. Patient states that it feels fine "especially if she eats."  MD at urgent care completed several labs which were all normal and an x-ray of the neck which was also normal.   Family states that the patient is eating good, has a very good appetite, states pt does not refuse any food. Started the Remeron last visit. Sleeping has also gotten better after starting this drug.   Family states since the last visit she is has not been really been hallucinating and talking to people who aren't there. Family states that has improved drastically.   Family states that lately sometimes in the morning she will not dress herself like she used to do. However during the day sometimes she will go and dress herself without any assistance. She is also telling her family when she has to go to the bathroom, as if she needs permission. Family states before she would just go to the [place].  Review of Systems: Information provided by family- pt unable to participate d/t dementia Review of Systems  Constitutional: Negative for fever, chills and weight loss.  HENT: Negative for ear pain and sore throat.        Family states that she eats all day and never c/o about throat hurting  Respiratory: Negative for shortness of breath.   Cardiovascular: Negative for chest pain.  Gastrointestinal: Positive for  constipation.       Ongoing issue. Family states that they use prune juice, apple juice and miralax  Genitourinary: Negative for dysuria, urgency and frequency.  Musculoskeletal: Negative for falls.  Psychiatric/Behavioral: Positive for memory loss. Negative for hallucinations.   Past Medical History  Diagnosis Date  . Stroke   . Hyperlipidemia   . Confusion   . Alzheimer's dementia   . Hypertension   . Anemia   . Vascular dementia, uncomplicated   . Acute posthemorrhagic anemia   . Pain in joint, pelvic region and thigh   . Open fracture of unspecified part of neck of femur   . Pain in joint, pelvic region and thigh   . Unspecified constipation   . Urinary tract infection, site not specified   . Hemorrhage of rectum and anus   . Abnormality of gait   . Memory loss   . Dementia in conditions classified elsewhere without behavioral disturbance(294.10)   . Myalgia and myositis, unspecified   . Personal history of fall   . Other and unspecified hyperlipidemia   . Spastic hemiplegia affecting dominant side   . Peripheral vascular disease, unspecified   . Pain in joint, lower leg   . Osteoporosis, unspecified   . Unspecified urinary incontinence   . Unspecified essential hypertension   . Occlusion and stenosis of carotid artery without mention of cerebral infarction   . Unspecified late effects of cerebrovascular disease   . Allergic  rhinitis due to pollen     Past Surgical History  Procedure Laterality Date  . Partial hip arthroplasty    . Abdominal hysterectomy      Social History:   reports that she has never smoked. She has never used smokeless tobacco. She reports that she does not drink alcohol or use illicit drugs.  No family history on file.  Medications: Patient's Medications  New Prescriptions   No medications on file  Previous Medications   ACETAMINOPHEN (TYLENOL) 325 MG TABLET    Take 325 mg by mouth every 6 (six) hours as needed. For pain   CALCIUM  CARB-CHOLECALCIFEROL (CALCIUM-VITAMIN D) 600-400 MG-UNIT TABS    Take 1 tablet by mouth daily.   CLOPIDOGREL (PLAVIX) 75 MG TABLET    Take 1 tablet (75 mg total) by mouth daily.   ENSURE (ENSURE)    Take 237 mLs by mouth 3 (three) times daily between meals.   MELATONIN 1 MG TABS    Take 2 tablets by mouth at bedtime.    MIRTAZAPINE (REMERON) 7.5 MG TABLET    Take 1 tablet (7.5 mg total) by mouth at bedtime.   MULTIPLE VITAMIN (MULITIVITAMIN WITH MINERALS) TABS    Take 1 tablet by mouth daily.   POLYETHYLENE GLYCOL (MIRALAX / GLYCOLAX) PACKET    Take 17 g by mouth daily as needed. For constipation   RALOXIFENE (EVISTA) 60 MG TABLET    Take 1 tablet (60 mg total) by mouth daily.   WITCH HAZEL (PREPARATION H EX)    Apply 1 application topically daily.  Modified Medications   Modified Medication Previous Medication   SIMVASTATIN (ZOCOR) 40 MG TABLET simvastatin (ZOCOR) 40 MG tablet      Take one tablet by mouth every day for cholesterol    Take one tablet by mouth every day for cholesterol  Discontinued Medications   RALOXIFENE (EVISTA) 60 MG TABLET    Take one tablet once a day for bones   Physical Exam: Filed Vitals:   04/14/13 0800  BP: 130/80  Pulse: 61  Temp: 97.4 F (36.3 C)  TempSrc: Oral  Resp: 18  Height: 5\' 6"  (1.676 m)  Weight: 110 lb 6.4 oz (50.077 kg)  SpO2: 99%   Physical Exam  Constitutional: She appears well-developed and well-nourished.  Frail black female  HENT:  Head: Normocephalic and atraumatic.  Neck: Normal range of motion.  Cardiovascular: Normal rate, regular rhythm, normal heart sounds and intact distal pulses.   Pulmonary/Chest: Effort normal and breath sounds normal.  Abdominal: Soft. Bowel sounds are normal.  Lymphadenopathy:    She has cervical adenopathy.  Neurological: She is alert.  aphasic  Skin: Skin is warm and dry.  Psychiatric: She has a normal mood and affect.     Labs reviewed: Basic Metabolic Panel:  Recent Labs  16/10/96 1450  09/20/12 0857 04/12/13 1129  NA 142 141 141  K 3.4* 3.9 3.8  CL 106 105 105  CO2 28 24 25   GLUCOSE 86 87 79  BUN 16 15 14   CREATININE 0.79 0.90 0.63  CALCIUM 9.7 9.2 9.7   Liver Function Tests:  Recent Labs  06/30/12 1450 04/12/13 1129  AST 26 27  ALT 16 19  ALKPHOS 48 54  BILITOT 0.4 0.6  PROT 7.3 6.7  ALBUMIN 4.0 3.8   CBC:  Recent Labs  06/30/12 1450 09/20/12 0857 04/12/13 1143  WBC 6.4 3.7 6.5  NEUTROABS  --  1.8  --   HGB 12.1 11.8 12.7  HCT 35.5* 35.7 42.0  MCV 95.7 96 102.5*  PLT 265  --   --    Past Procedures: cspine xray at urgent care:  Degenerative disc disease is noted at C3-4, C6-7 and C7-T1. No acute  abnormality seen in the cervical spine.  Assessment/Plan 1. Vascular dementia with delusions -has improved -not having delusions or hallucinations any longer -seems her aphasia is significantly worse (but also w/o teeth--she threw out her dentures by accident)  2. Urinary incontinence -no change, persists  3. Failure to thrive in adult -weight has been stable last few months  -no longer having any GI bleeding and h/h improved  4. Insomnia -a little better--has good and bad nights -cont melatonin a couple of hours before bed  5. Degenerative arthritis of cervical spine -use tylenol for pain -also has some cervical adenopathy associated with sore throat so will monitor bilateral submandibular nodes and sore throat which are improving  6. Anemia, iron deficiency -cbc improved with iron supplements  Labs/tests ordered:  Just had labs at urgent care Next appt:  3 mos

## 2013-04-15 ENCOUNTER — Encounter: Payer: Self-pay | Admitting: Family Medicine

## 2013-04-16 ENCOUNTER — Encounter: Payer: Self-pay | Admitting: Family Medicine

## 2013-05-09 ENCOUNTER — Other Ambulatory Visit: Payer: Self-pay | Admitting: Internal Medicine

## 2013-07-14 ENCOUNTER — Encounter: Payer: Self-pay | Admitting: Internal Medicine

## 2013-07-18 ENCOUNTER — Ambulatory Visit (INDEPENDENT_AMBULATORY_CARE_PROVIDER_SITE_OTHER): Payer: Medicare Other | Admitting: Internal Medicine

## 2013-07-18 ENCOUNTER — Encounter: Payer: Self-pay | Admitting: Internal Medicine

## 2013-07-18 VITALS — BP 110/68 | HR 64 | Resp 10 | Wt 109.0 lb

## 2013-07-18 DIAGNOSIS — G4700 Insomnia, unspecified: Secondary | ICD-10-CM | POA: Diagnosis not present

## 2013-07-18 DIAGNOSIS — M810 Age-related osteoporosis without current pathological fracture: Secondary | ICD-10-CM

## 2013-07-18 DIAGNOSIS — K5900 Constipation, unspecified: Secondary | ICD-10-CM

## 2013-07-18 DIAGNOSIS — Z23 Encounter for immunization: Secondary | ICD-10-CM

## 2013-07-18 DIAGNOSIS — R32 Unspecified urinary incontinence: Secondary | ICD-10-CM

## 2013-07-18 DIAGNOSIS — K5909 Other constipation: Secondary | ICD-10-CM

## 2013-07-18 DIAGNOSIS — F0150 Vascular dementia without behavioral disturbance: Secondary | ICD-10-CM

## 2013-07-18 DIAGNOSIS — D509 Iron deficiency anemia, unspecified: Secondary | ICD-10-CM | POA: Diagnosis not present

## 2013-07-18 MED ORDER — MIRTAZAPINE 15 MG PO TABS: 15.0000 mg | ORAL_TABLET | Freq: Every day | ORAL | Status: DC

## 2013-07-18 MED ORDER — TETANUS-DIPHTH-ACELL PERTUSSIS 5-2.5-18.5 LF-MCG/0.5 IM SUSP: 0.5000 mL | Freq: Once | INTRAMUSCULAR | Status: DC

## 2013-07-18 NOTE — Progress Notes (Signed)
Patient ID: Shirley Gray, female   DOB: [birth year], 80 y.o.   MRN: [id number]   Location:  [facility]  Code Status: DNR  No Known Allergies  Chief Complaint  Patient presents with  . Medical Managment of Chronic Issues    3 month follow-up   . Medication Management    Discuss increasing Remeron to whole tablet daily vs 1/2 daily     HPI: Patient is a 80 y.o. frail black female seen in the office today for medical mgt of chronic diseases.  She is accompanied by her daughter who notes she is not sleeping at night.  She is keeping her awake in the daytime to prevent this, but it is not helping.  remeron 7.5mg  is not adequate anymore.    Review of Systems:  Review of Systems  Constitutional: Negative for fever and chills.       Still eats 24x7, even has 1/2 sandwich at bedtime; no change in strength;  Is stubborn, moves slower than she used to, still walks quite a bit  HENT: Negative for congestion.   Eyes: Negative for blurred vision.  Respiratory: Negative for shortness of breath.   Cardiovascular: Negative for chest pain and leg swelling.  Gastrointestinal: Negative for abdominal pain, constipation, blood in stool and melena.       Gets a little prune and apple juice each morning and miralax 2-3x per week;  If missed, ends up with little pellets and needs disimpaction  Genitourinary: Negative for dysuria, urgency and frequency.       No incontinence  Musculoskeletal: Negative for back pain, falls and joint pain.       Still walks at home with the cane or just on her own, no falls  Neurological: Negative for dizziness, loss of consciousness and headaches.  Psychiatric/Behavioral: Positive for memory loss. Negative for depression. The patient has insomnia.        Only taking 1/2 mirtazapine--packs her bags at night and then up all day also;  Another relative helped her when one daughter was having surgery--was refusing everything during that  time including showers;  Mood is good     Past Medical History  Diagnosis Date  . Stroke   . Hyperlipidemia   . Confusion   . Alzheimer's dementia   . Hypertension   . Anemia   . Vascular dementia, uncomplicated   . Acute posthemorrhagic anemia   . Pain in joint, pelvic region and thigh   . Open fracture of unspecified part of neck of femur   . Pain in joint, pelvic region and thigh   . Unspecified constipation   . Urinary tract infection, site not specified   . Hemorrhage of rectum and anus   . Abnormality of gait   . Memory loss   . Dementia in conditions classified elsewhere without behavioral disturbance   . Myalgia and myositis, unspecified   . Personal history of fall   . Other and unspecified hyperlipidemia   . Spastic hemiplegia affecting dominant side   . Peripheral vascular disease, unspecified   . Pain in joint, lower leg   . Osteoporosis, unspecified   . Unspecified urinary incontinence   . Unspecified essential hypertension   . Occlusion and stenosis of carotid artery without mention of cerebral infarction   . Unspecified late effects of cerebrovascular disease   . Allergic rhinitis due to pollen     Past Surgical History  Procedure Laterality Date  . Partial hip  arthroplasty    . Abdominal hysterectomy      Social History:   reports that she has never smoked. She has never used smokeless tobacco. She reports that she does not drink alcohol or use illicit drugs.  History reviewed. No pertinent family history.  Medications: Patient's Medications  New Prescriptions   No medications on file  Previous Medications   ACETAMINOPHEN (TYLENOL) 325 MG TABLET    Take 325 mg by mouth every 6 (six) hours as needed. For pain   CALCIUM CARB-CHOLECALCIFEROL (CALCIUM-VITAMIN D) 600-400 MG-UNIT TABS    Take 1 tablet by mouth daily.   CLOPIDOGREL (PLAVIX) 75 MG TABLET    Take 1 tablet (75 mg total) by mouth daily.   ENSURE (ENSURE)    Take 237 mLs by mouth 3 (three)  times daily between meals.   MELATONIN 1 MG TABS    Take 2 tablets by mouth at bedtime.    MIRTAZAPINE (REMERON) 15 MG TABLET    TAKE 1/2 TABLET BY MOUTH EVERY NIGHT AT BEDTIME   MIRTAZAPINE (REMERON) 7.5 MG TABLET    Take 1 tablet (7.5 mg total) by mouth at bedtime.   MULTIPLE VITAMIN (MULITIVITAMIN WITH MINERALS) TABS    Take 1 tablet by mouth daily.   POLYETHYLENE GLYCOL (MIRALAX / GLYCOLAX) PACKET    Take 17 g by mouth daily as needed. For constipation   RALOXIFENE (EVISTA) 60 MG TABLET    Take 1 tablet (60 mg total) by mouth daily.   SIMVASTATIN (ZOCOR) 40 MG TABLET    Take one tablet by mouth every day for cholesterol   TDAP (BOOSTRIX) 5-2.5-18.5 LF-MCG/0.5 INJECTION    Inject 0.5 mLs into the muscle once.   WITCH HAZEL (PREPARATION H EX)    Apply 1 application topically daily.  Modified Medications   No medications on file  Discontinued Medications   No medications on file     Physical Exam: Filed Vitals:   07/18/13 0956  BP: 110/68  Pulse: 64  Resp: 10  Weight: 109 lb (49.442 kg)  Physical Exam  Constitutional: No distress.  HENT:  Head: Normocephalic and atraumatic.  Cardiovascular: Normal rate, regular rhythm, normal heart sounds and intact distal pulses.   Pulmonary/Chest: Effort normal and breath sounds normal. No respiratory distress. She exhibits no tenderness.  Abdominal: Soft. Bowel sounds are normal. She exhibits no distension and no mass. There is no tenderness.  Musculoskeletal:  Left hemiparesis  Neurological: She is alert.  Right hemiparesis; pleasantly confused--oriented to person only  Skin: Skin is warm and dry.  Psychiatric: She has a normal mood and affect.     Labs reviewed: Basic Metabolic Panel:  Recent Labs  96/29/5204/21/14 0857 04/12/13 1129  NA 141 141  K 3.9 3.8  CL 105 105  CO2 24 25  GLUCOSE 87 79  BUN 15 14  CREATININE 0.90 0.63  CALCIUM 9.2 9.7   Liver Function Tests:  Recent Labs  04/12/13 1129  AST 27  ALT 19  ALKPHOS 54    BILITOT 0.6  PROT 6.7  ALBUMIN 3.8  CBC:  Recent Labs  09/20/12 0857 04/12/13 1143  WBC 3.7 6.5  NEUTROABS 1.8  --   HGB 11.8 12.7  HCT 35.7 42.0  MCV 96 102.5*    Assessment/Plan 1. Insomnia -biggest active problem at this time -will increase remeron to 15mg  at bedtime -they have stopped the melatonin b/c it did not work  2. Vascular dementia -is progressing--seems increasingly confused each visit -eats very well  3. Anemia, iron deficiency -f/u cbc due to h/o gi bleed and ongoing plavix use  4. Urinary incontinence -not recently problematic and has been ambulatory with cane or unassisted lately w/o falls  5. Chronic constipation -cont miralax and the apple and prune juice combination as they are doing  6. Senile osteoporosis -cont evista  7.  Need for tdap:  Script printed for her today to take to pharmacy  Labs/tests ordered:   Orders Placed This Encounter  Procedures  . CBC with Differential  . Comprehensive metabolic panel  . DNR (Do Not Resuscitate)    Order Specific Question:  Maintain current active treatments    Answer:  Yes    Order Specific Question:  Do not initiate new interventions    Answer:  Yes   Next appt:  6 mos

## 2013-07-19 LAB — COMPREHENSIVE METABOLIC PANEL
ALT: 19 IU/L (ref 0–32)
AST: 23 IU/L (ref 0–40)
Albumin/Globulin Ratio: 1.7 (ref 1.1–2.5)
Albumin: 3.7 g/dL (ref 3.5–4.7)
Alkaline Phosphatase: 47 IU/L (ref 39–117)
BUN/Creatinine Ratio: 18 (ref 11–26)
BUN: 13 mg/dL (ref 8–27)
CO2: 26 mmol/L (ref 18–29)
Calcium: 9.3 mg/dL (ref 8.7–10.3)
Chloride: 106 mmol/L (ref 97–108)
Creatinine, Ser: 0.74 mg/dL (ref 0.57–1.00)
GFR calc Af Amer: 86 mL/min/{1.73_m2} (ref >59)
GFR calc non Af Amer: 75 mL/min/{1.73_m2} (ref >59)
Globulin, Total: 2.2 g/dL (ref 1.5–4.5)
Glucose: 69 mg/dL (ref 65–99)
Potassium: 3.7 mmol/L (ref 3.5–5.2)
Sodium: 144 mmol/L (ref 134–144)
Total Bilirubin: 0.3 mg/dL (ref 0.0–1.2)
Total Protein: 5.9 g/dL — ABNORMAL LOW (ref 6.0–8.5)

## 2013-07-19 LAB — CBC WITH DIFFERENTIAL/PLATELET
Basophils Absolute: 0 10*3/uL (ref 0.0–0.2)
Basos: 0 %
Eos: 1 %
Eosinophils Absolute: 0 10*3/uL (ref 0.0–0.4)
HCT: 35 % (ref 34.0–46.6)
Hemoglobin: 11.7 g/dL (ref 11.1–15.9)
Immature Grans (Abs): 0 10*3/uL (ref 0.0–0.1)
Immature Granulocytes: 0 %
Lymphocytes Absolute: 1.7 10*3/uL (ref 0.7–3.1)
Lymphs: 47 %
MCH: 32 pg (ref 26.6–33.0)
MCHC: 33.4 g/dL (ref 31.5–35.7)
MCV: 96 fL (ref 79–97)
Monocytes Absolute: 0.5 10*3/uL (ref 0.1–0.9)
Monocytes: 13 %
Neutrophils Absolute: 1.4 10*3/uL (ref 1.4–7.0)
Neutrophils Relative %: 39 %
RBC: 3.66 x10E6/uL — ABNORMAL LOW (ref 3.77–5.28)
RDW: 12.5 % (ref 12.3–15.4)
WBC: 3.6 10*3/uL (ref 3.4–10.8)

## 2013-08-10 DIAGNOSIS — Z1231 Encounter for screening mammogram for malignant neoplasm of breast: Secondary | ICD-10-CM | POA: Diagnosis not present

## 2013-08-10 DIAGNOSIS — Z803 Family history of malignant neoplasm of breast: Secondary | ICD-10-CM | POA: Diagnosis not present

## 2013-08-23 ENCOUNTER — Encounter: Payer: Self-pay | Admitting: Internal Medicine

## 2013-11-05 ENCOUNTER — Other Ambulatory Visit: Payer: Self-pay | Admitting: Internal Medicine

## 2013-11-09 ENCOUNTER — Other Ambulatory Visit: Payer: Self-pay | Admitting: *Deleted

## 2013-11-09 MED ORDER — RALOXIFENE HCL 60 MG PO TABS: 60.0000 mg | ORAL_TABLET | Freq: Every day | ORAL | Status: DC

## 2013-11-09 NOTE — Telephone Encounter (Signed)
[facility] [address] 

## 2013-12-15 ENCOUNTER — Encounter (HOSPITAL_COMMUNITY): Payer: Self-pay | Admitting: Emergency Medicine

## 2013-12-15 ENCOUNTER — Telehealth: Payer: Self-pay | Admitting: *Deleted

## 2013-12-15 ENCOUNTER — Emergency Department (INDEPENDENT_AMBULATORY_CARE_PROVIDER_SITE_OTHER)
Admission: EM | Admit: 2013-12-15 | Discharge: 2013-12-15 | Disposition: A | Discharge status: Home / Self Care | Payer: Medicare Other | Source: Home / Self Care

## 2013-12-15 DIAGNOSIS — K625 Hemorrhage of anus and rectum: Secondary | ICD-10-CM | POA: Diagnosis not present

## 2013-12-15 DIAGNOSIS — K649 Unspecified hemorrhoids: Secondary | ICD-10-CM

## 2013-12-15 DIAGNOSIS — K648 Other hemorrhoids: Secondary | ICD-10-CM

## 2013-12-15 DIAGNOSIS — K5900 Constipation, unspecified: Secondary | ICD-10-CM | POA: Diagnosis not present

## 2013-12-15 MED ORDER — HYDROCORTISONE ACETATE 25 MG RE SUPP: 25.0000 mg | Freq: Two times a day (BID) | RECTAL | Status: DC

## 2013-12-15 NOTE — ED Provider Notes (Signed)
CSN: [id number]     Arrival date & time 12/15/13  1837 History   None    Chief Complaint  Patient presents with  . Rectal Bleeding   (Consider location/radiation/quality/duration/timing/severity/associated sxs/prior Treatment) HPI Comments: 80 yo with complaint of dark blood on toilet paper after bowel movement earlier today followed by bright red blood with next bathroom episode. Patient has history of hemorrhoids but has not noticed any externally. She has constipation history and was given Miralax yesterday. Patient has Dementia and history is provided by her daughter.   Patient is a 80 y.o. female presenting with hematochezia.  Rectal Bleeding   Past Medical History  Diagnosis Date  . Stroke   . Hyperlipidemia   . Confusion   . Alzheimer's dementia   . Hypertension   . Anemia   . Vascular dementia, uncomplicated   . Acute posthemorrhagic anemia   . Pain in joint, pelvic region and thigh   . Open fracture of unspecified part of neck of femur   . Pain in joint, pelvic region and thigh   . Unspecified constipation   . Urinary tract infection, site not specified   . Hemorrhage of rectum and anus   . Abnormality of gait   . Memory loss   . Dementia in conditions classified elsewhere without behavioral disturbance   . Myalgia and myositis, unspecified   . Personal history of fall   . Other and unspecified hyperlipidemia   . Spastic hemiplegia affecting dominant side   . Peripheral vascular disease, unspecified   . Pain in joint, lower leg   . Osteoporosis, unspecified   . Unspecified urinary incontinence   . Unspecified essential hypertension   . Occlusion and stenosis of carotid artery without mention of cerebral infarction   . Unspecified late effects of cerebrovascular disease   . Allergic rhinitis due to pollen    Past Surgical History  Procedure Laterality Date  . Partial hip arthroplasty    . Abdominal hysterectomy     History reviewed. No pertinent family  history. History  Substance Use Topics  . Smoking status: Never Smoker   . Smokeless tobacco: Never Used  . Alcohol Use: No   OB History   Grav Para Term Preterm Abortions TAB SAB Ect Mult Living                 Review of Systems  Gastrointestinal: Positive for constipation, hematochezia and anal bleeding.  All other systems reviewed and are negative.   Allergies  Review of patient's allergies indicates no known allergies.  Home Medications   Prior to Admission medications   Medication Sig Start Date End Date Taking? Authorizing Provider  acetaminophen (TYLENOL) 325 MG tablet Take 325 mg by mouth every 6 (six) hours as needed. For pain    Historical Provider, MD  Calcium Carb-Cholecalciferol (CALCIUM-VITAMIN D) 600-400 MG-UNIT TABS Take 1 tablet by mouth daily.    Historical Provider, MD  clopidogrel (PLAVIX) 75 MG tablet TAKE 1 TABLET BY MOUTH DAILY    Tiffany L Reed, DO  ENSURE (ENSURE) Take 237 mLs by mouth 3 (three) times daily between meals.    Historical Provider, MD  mirtazapine (REMERON) 15 MG tablet Take 1 tablet (15 mg total) by mouth at bedtime. 07/18/13   Tiffany L Reed, DO  Multiple Vitamin (MULITIVITAMIN WITH MINERALS) TABS Take 1 tablet by mouth daily.    Historical Provider, MD  polyethylene glycol (MIRALAX / GLYCOLAX) packet Take 17 g by mouth daily as needed. For constipation  Historical Provider, MD  raloxifene (EVISTA) 60 MG tablet Take 1 tablet (60 mg total) by mouth daily. 11/09/13   Kimber RelicArthur G Green, MD  simvastatin (ZOCOR) 40 MG tablet Take one tablet by mouth every day for cholesterol 04/14/13   Tiffany L Reed, DO  Tdap (BOOSTRIX) 5-2.5-18.5 LF-MCG/0.5 injection Inject 0.5 mLs into the muscle once. 07/18/13   Tiffany L Reed, DO  Witch Hazel (PREPARATION H EX) Apply 1 application topically daily.    Historical Provider, MD   BP 113/70  Pulse 67  Temp(Src) 98 F (36.7 C) (Oral)  Resp 24  SpO2 100% Physical Exam  Nursing note and vitals  reviewed. Constitutional: She is oriented to person, place, and time. She appears well-developed and well-nourished. No distress.  HENT:  Head: Normocephalic and atraumatic.  Right Ear: External ear normal.  Left Ear: External ear normal.  Nose: Nose normal.  Mouth/Throat: Oropharynx is clear and moist.  Eyes: Conjunctivae and EOM are normal.  Neck: Normal range of motion. Neck supple. No JVD present. No thyromegaly present.  Cardiovascular: Normal rate, regular rhythm, normal heart sounds and intact distal pulses.   Pulmonary/Chest: Effort normal and breath sounds normal.  Abdominal: Soft. Bowel sounds are normal. She exhibits no distension and no mass. There is no tenderness. There is no rebound and no guarding.  Genitourinary: Guaiac positive stool.  + red blood external anus small amount fresh, ? Internal hemorrhoid  Musculoskeletal: Normal range of motion. She exhibits no edema and no tenderness.  Lymphadenopathy:    She has no cervical adenopathy.  Neurological: She is alert and oriented to person, place, and time. No cranial nerve deficit.  Skin: Skin is warm and dry. No rash noted. No erythema. No pallor.    ED Course  Procedures (including critical care time) Labs Review Labs Reviewed - No data to display  Imaging Review No results found.   MDM  1. Rectal bleeding probable internal hemorrhoid- Anusol Supository AD, f/u 1 week PCP for recheck, w/c if SX increase or ER.   2. Constipation- Increase fiber/ water intake, decrease caffeine, increase activity level, can add Miralax or Metamucil QD until BM soft    Berenice PrimasMelissa R Lorrine Killilea, PA-C 12/15/13 2042

## 2013-12-15 NOTE — Discharge Instructions (Signed)
Rectal Bleeding  Rectal bleeding is when blood comes out of the opening of the butt (anus). Rectal bleeding may show up as bright red blood or really dark poop (stool). The poop may look dark red, maroon, or black. Rectal bleeding is often a sign that something is wrong. This needs to be checked by a doctor.  HOME CARE  Eat a diet high in fiber. This will help keep your poop soft.  Limit activitiy.  Drink enough fluids to keep your pee (urine) clear or pale yellow.  Take a warm bath to soothe any pain.  Follow up with your doctor as told. GET HELP RIGHT AWAY IF:  You have more bleeding.  You have black or dark red poop.  You throw up (vomit) blood or it looks like coffee grounds.  You have belly (abdominal) pain or tenderness.  You have a fever.  You feel weak, sick to your stomach (nauseous), or you pass out (faint).  You have pain that is so bad you cannot poop (bowel movement). MAKE SURE YOU:  Understand these instructions.  Will watch your condition.  Will get help right away if you are not doing well or get worse. Document Released: 01/29/2011 Document Revised: 08/11/2011 Document Reviewed: 01/29/2011 [facility] Patient Information 2015 [employer], [place]. This information is not intended to replace advice given to you by your health care provider. Make sure you discuss any questions you have with your health care provider.   Constipation Constipation is when a person:  Poops (has a bowel movement) less than 3 times a week.  Has a hard time pooping.  Has poop that is dry, hard, or bigger than normal. HOME CARE   Eat foods with a lot of fiber in them. This includes fruits, vegetables, beans, and whole grains such as brown rice.  Avoid fatty foods and foods with a lot of sugar. This includes french fries, hamburgers, cookies, candy, and soda.  If you are not getting enough fiber from food, take products with added fiber in them (supplements).  Drink enough fluid  to keep your pee (urine) clear or pale yellow.  Exercise on a regular basis, or as told by your doctor.  Go to the restroom when you feel like you need to poop. Do not hold it.  Only take medicine as told by your doctor. Do not take medicines that help you poop (laxatives) without talking to your doctor first. GET HELP RIGHT AWAY IF:   You have bright red blood in your poop (stool).  Your constipation lasts more than 4 days or gets worse.  You have belly (abdominal) or butt (rectal) pain.  You have thin poop (as thin as a pencil).  You lose weight, and it cannot be explained. MAKE SURE YOU:   Understand these instructions.  Will watch your condition.  Will get help right away if you are not doing well or get worse. Document Released: 11/05/2007 Document Revised: 05/24/2013 Document Reviewed: 02/28/2013 [facility] Patient Information 2015 [place], [place]. This information is not intended to replace advice given to you by your health care provider. Make sure you discuss any questions you have with your health care provider.

## 2013-12-15 NOTE — Telephone Encounter (Signed)
Caregiver took patient to bathroom and thought she had blood in her bowel movement. Caregiver thought about it and patient had ate cherries last night. Caregiver laughed and said she will continue to watch it but she stated that the patient is fine and it was just what she ate.

## 2013-12-15 NOTE — ED Notes (Signed)
Daughter noted dark blood on tissue this after BM.  This afternoon she had another bowel movment and it was a brighter red. No blood in the toilet or in stool.

## 2013-12-20 NOTE — ED Provider Notes (Signed)
Medical screening examination/treatment/procedure(s) were performed by resident physician or non-physician practitioner and as supervising physician I was immediately available for consultation/collaboration.   Barkley BrunsKINDL,JAMES DOUGLAS MD.   Linna HoffJames D Kindl, MD 12/20/13 [phone]

## 2013-12-22 ENCOUNTER — Ambulatory Visit (INDEPENDENT_AMBULATORY_CARE_PROVIDER_SITE_OTHER): Payer: Medicare Other | Admitting: Internal Medicine

## 2013-12-22 ENCOUNTER — Encounter: Payer: Self-pay | Admitting: Internal Medicine

## 2013-12-22 VITALS — BP 98/60 | HR 65 | Temp 96.9°F | Resp 10 | Wt 105.0 lb

## 2013-12-22 DIAGNOSIS — K5909 Other constipation: Secondary | ICD-10-CM

## 2013-12-22 DIAGNOSIS — K5900 Constipation, unspecified: Secondary | ICD-10-CM | POA: Diagnosis not present

## 2013-12-22 DIAGNOSIS — K648 Other hemorrhoids: Secondary | ICD-10-CM | POA: Diagnosis not present

## 2013-12-22 DIAGNOSIS — R627 Adult failure to thrive: Secondary | ICD-10-CM

## 2013-12-22 DIAGNOSIS — M810 Age-related osteoporosis without current pathological fracture: Secondary | ICD-10-CM

## 2013-12-22 DIAGNOSIS — D509 Iron deficiency anemia, unspecified: Secondary | ICD-10-CM | POA: Diagnosis not present

## 2013-12-22 DIAGNOSIS — F0150 Vascular dementia without behavioral disturbance: Secondary | ICD-10-CM

## 2013-12-22 NOTE — Progress Notes (Signed)
Patient ID: Shirley Gray, female   DOB: [birth year], 80 y.o.   MRN: [id number]   Location:  [facility]  Code Status: DNR  No Known Allergies  Chief Complaint  Patient presents with  . Follow-up    F/u from blood in stool, dx with hemorrhoid. Symptoms improved     HPI: Patient is a 80 y.o.  seen in the office today for med mgt chronic diseases.  Had been to hospital with GI bleeding felt to be due to internal hemorrhoids.  Had no pain.  No recurrent bleeding.  Using the anusol suppositories.  Stools are normal.  Only had diverticulitis one time.    Mentation about the same.  Still walking around the house.  Still up at night all night sometimes.  Continues to eat more and lose weight.  Discussed normal pathology of dementia and that we do not know if something is going on in the bowels, but they do not want intervention if there is.  Denies pains or concerns herself.  Is ready to leave the house and wander around at 5pm.    Review of Systems:  Review of Systems  Constitutional: Negative for fever and chills.  HENT: Negative for congestion.   Eyes: Negative for blurred vision.  Respiratory: Negative for shortness of breath.   Cardiovascular: Negative for chest pain and leg swelling.  Gastrointestinal: Negative for abdominal pain, constipation, blood in stool and melena.       No further  Genitourinary: Negative for dysuria.  Musculoskeletal: Negative for falls, joint pain and myalgias.       Unsteady gait  Skin: Negative for rash.  Neurological: Positive for speech change. Negative for dizziness.  Psychiatric/Behavioral: Positive for memory loss.    Past Medical History  Diagnosis Date  . Stroke   . Hyperlipidemia   . Confusion   . Alzheimer's dementia   . Hypertension   . Anemia   . Vascular dementia, uncomplicated   . Acute posthemorrhagic anemia   . Pain in joint, pelvic region and thigh   . Open fracture of unspecified part  of neck of femur   . Pain in joint, pelvic region and thigh   . Unspecified constipation   . Urinary tract infection, site not specified   . Hemorrhage of rectum and anus   . Abnormality of gait   . Memory loss   . Dementia in conditions classified elsewhere without behavioral disturbance   . Myalgia and myositis, unspecified   . Personal history of fall   . Other and unspecified hyperlipidemia   . Spastic hemiplegia affecting dominant side   . Peripheral vascular disease, unspecified   . Pain in joint, lower leg   . Osteoporosis, unspecified   . Unspecified urinary incontinence   . Unspecified essential hypertension   . Occlusion and stenosis of carotid artery without mention of cerebral infarction   . Unspecified late effects of cerebrovascular disease   . Allergic rhinitis due to pollen     Past Surgical History  Procedure Laterality Date  . Partial hip arthroplasty    . Abdominal hysterectomy      Social History:   reports that she has never smoked. She has never used smokeless tobacco. She reports that she does not drink alcohol or use illicit drugs.  History reviewed. No pertinent family history.  Medications: Patient's Medications  New Prescriptions   No medications on file  Previous Medications   ACETAMINOPHEN (TYLENOL) 325 MG TABLET  Take 325 mg by mouth every 6 (six) hours as needed. For pain   CALCIUM CARB-CHOLECALCIFEROL (CALCIUM-VITAMIN D) 600-400 MG-UNIT TABS    Take 1 tablet by mouth daily.   CLOPIDOGREL (PLAVIX) 75 MG TABLET    TAKE 1 TABLET BY MOUTH DAILY   HYDROCORTISONE (ANUSOL-HC) 25 MG SUPPOSITORY    Place 1 suppository (25 mg total) rectally 2 (two) times daily.   MULTIPLE VITAMIN (MULITIVITAMIN WITH MINERALS) TABS    Take 1 tablet by mouth daily.   POLYETHYLENE GLYCOL (MIRALAX / GLYCOLAX) PACKET    Take 17 g by mouth daily as needed. For constipation   SIMVASTATIN (ZOCOR) 40 MG TABLET    Take one tablet by mouth every day for cholesterol   WITCH  HAZEL (PREPARATION H EX)    Apply 1 application topically daily.  Modified Medications   Modified Medication Previous Medication   MIRTAZAPINE (REMERON) 15 MG TABLET mirtazapine (REMERON) 15 MG tablet      Take 15 mg by mouth at bedtime. For Insomnia    Take 1 tablet (15 mg total) by mouth at bedtime.   RALOXIFENE (EVISTA) 60 MG TABLET raloxifene (EVISTA) 60 MG tablet      Take 60 mg by mouth daily. For Osteoporosis    Take 1 tablet (60 mg total) by mouth daily.  Discontinued Medications   ENSURE (ENSURE)    Take 237 mLs by mouth 3 (three) times daily between meals.   TDAP (BOOSTRIX) 5-2.5-18.5 LF-MCG/0.5 INJECTION    Inject 0.5 mLs into the muscle once.     Physical Exam: Filed Vitals:   12/22/13 0804  BP: 98/60  Pulse: 65  Temp: 96.9 F (36.1 C)  Resp: 10  Weight: 105 lb (47.628 kg)  SpO2: 97%  Physical Exam  Constitutional: No distress.  Frail black female seated in wheelchair  Cardiovascular: Normal rate, regular rhythm, normal heart sounds and intact distal pulses.   Pulmonary/Chest: Effort normal and breath sounds normal.  Abdominal: Soft. Bowel sounds are normal. She exhibits no distension and no mass. There is no tenderness.  Musculoskeletal:  hemiparetic  Neurological: She is alert.  Facial droop, dysarthria, chronic  Skin: Skin is warm and dry.  Psychiatric: She has a normal mood and affect.     Labs reviewed: Basic Metabolic Panel:  Recent Labs  16/03/9610/11/14 1129 07/18/13 1054  NA 141 144  K 3.8 3.7  CL 105 106  CO2 25 26  GLUCOSE 79 69  BUN 14 13  CREATININE 0.63 0.74  CALCIUM 9.7 9.3   Liver Function Tests:  Recent Labs  04/12/13 1129 07/18/13 1054  AST 27 23  ALT 19 19  ALKPHOS 54 47  BILITOT 0.6 0.3  PROT 6.7 5.9*  ALBUMIN 3.8  --   CBC:  Recent Labs  04/12/13 1143 07/18/13 1054  WBC 6.5 3.6  NEUTROABS  --  1.4  HGB 12.7 11.7  HCT 42.0 35.0  MCV 102.5* 96   Assessment/Plan 1. Anemia, iron deficiency - will f/u labs b/c of GI  bleed she had  - CBC With differential/Platelet -is on plavix, but had severe stroke in past and family has opted to continue this  2. Internal hemorrhoid, bleeding -felt to be cause of GI bleed - CBC With differential/Platelet -continue suppositories as needed  3. Vascular dementia, without behavioral disturbance -is progressing with failure to thrive -comfort measures -has hcpoa Mayra NeerBarbara Holmes and this is in her chart -continues to lose weight despite eating well -is also very active all  day -may have a colon cancer, but it has been discussed several times that family does not want this aggressively investigated with cscope at this point  4. Chronic constipation -is well controlled  5. Senile osteoporosis -cont evista and ca with D  6. Failure to thrive in adult -cont remeron which has helped decrease her weight loss trend and help her mood some  Labs/tests ordered:  cbc Next appt:  6 mos

## 2013-12-23 LAB — CBC WITH DIFFERENTIAL
Basophils Absolute: 0 10*3/uL (ref 0.0–0.2)
Basos: 0 %
Eos: 1 %
Eosinophils Absolute: 0.1 10*3/uL (ref 0.0–0.4)
HCT: 32.3 % — ABNORMAL LOW (ref 34.0–46.6)
Hemoglobin: 11.4 g/dL (ref 11.1–15.9)
Immature Grans (Abs): 0 10*3/uL (ref 0.0–0.1)
Immature Granulocytes: 0 %
Lymphocytes Absolute: 1.8 10*3/uL (ref 0.7–3.1)
Lymphs: 38 %
MCH: 34 pg — ABNORMAL HIGH (ref 26.6–33.0)
MCHC: 35.3 g/dL (ref 31.5–35.7)
MCV: 96 fL (ref 79–97)
Monocytes Absolute: 0.6 10*3/uL (ref 0.1–0.9)
Monocytes: 12 %
Neutrophils Absolute: 2.3 10*3/uL (ref 1.4–7.0)
Neutrophils Relative %: 49 %
Platelets: 234 10*3/uL (ref 150–379)
RBC: 3.35 x10E6/uL — ABNORMAL LOW (ref 3.77–5.28)
RDW: 12.6 % (ref 12.3–15.4)
WBC: 4.7 10*3/uL (ref 3.4–10.8)

## 2014-01-16 ENCOUNTER — Ambulatory Visit: Payer: Medicare Other | Admitting: Internal Medicine

## 2014-02-27 ENCOUNTER — Other Ambulatory Visit: Payer: Self-pay | Admitting: Internal Medicine

## 2014-03-01 DIAGNOSIS — Z23 Encounter for immunization: Secondary | ICD-10-CM | POA: Diagnosis not present

## 2014-04-11 ENCOUNTER — Other Ambulatory Visit: Payer: Self-pay | Admitting: *Deleted

## 2014-04-11 MED ORDER — SIMVASTATIN 40 MG PO TABS: ORAL_TABLET | ORAL | Status: DC

## 2014-04-11 NOTE — Telephone Encounter (Signed)
[facility] [address]

## 2014-04-19 ENCOUNTER — Encounter: Payer: Self-pay | Admitting: Neurology

## 2014-04-25 ENCOUNTER — Encounter: Payer: Self-pay | Admitting: Neurology

## 2014-04-26 ENCOUNTER — Other Ambulatory Visit: Payer: Self-pay | Admitting: Internal Medicine

## 2014-05-03 ENCOUNTER — Other Ambulatory Visit: Payer: Self-pay | Admitting: Internal Medicine

## 2014-05-25 ENCOUNTER — Other Ambulatory Visit: Payer: Self-pay | Admitting: Internal Medicine

## 2014-05-29 ENCOUNTER — Other Ambulatory Visit: Payer: Self-pay

## 2014-05-29 MED ORDER — RALOXIFENE HCL 60 MG PO TABS: 60.0000 mg | ORAL_TABLET | Freq: Every day | ORAL | Status: DC

## 2014-06-26 ENCOUNTER — Ambulatory Visit: Payer: Medicare Other | Admitting: Internal Medicine

## 2014-06-26 ENCOUNTER — Encounter: Payer: Self-pay | Admitting: Internal Medicine

## 2014-06-26 ENCOUNTER — Ambulatory Visit (INDEPENDENT_AMBULATORY_CARE_PROVIDER_SITE_OTHER): Payer: Medicare Other | Admitting: Internal Medicine

## 2014-06-26 VITALS — BP 112/70 | HR 77 | Temp 97.7°F | Resp 10 | Ht 66.0 in | Wt 103.0 lb

## 2014-06-26 DIAGNOSIS — K5909 Other constipation: Secondary | ICD-10-CM

## 2014-06-26 DIAGNOSIS — K5900 Constipation, unspecified: Secondary | ICD-10-CM | POA: Diagnosis not present

## 2014-06-26 DIAGNOSIS — Z23 Encounter for immunization: Secondary | ICD-10-CM | POA: Diagnosis not present

## 2014-06-26 DIAGNOSIS — M810 Age-related osteoporosis without current pathological fracture: Secondary | ICD-10-CM

## 2014-06-26 DIAGNOSIS — G4700 Insomnia, unspecified: Secondary | ICD-10-CM

## 2014-06-26 DIAGNOSIS — F0150 Vascular dementia without behavioral disturbance: Secondary | ICD-10-CM | POA: Diagnosis not present

## 2014-06-26 DIAGNOSIS — K649 Unspecified hemorrhoids: Secondary | ICD-10-CM | POA: Diagnosis not present

## 2014-06-26 DIAGNOSIS — Z8673 Personal history of transient ischemic attack (TIA), and cerebral infarction without residual deficits: Secondary | ICD-10-CM | POA: Diagnosis not present

## 2014-06-26 DIAGNOSIS — R627 Adult failure to thrive: Secondary | ICD-10-CM | POA: Diagnosis not present

## 2014-06-26 MED ORDER — MIRTAZAPINE 15 MG PO TABS: 15.0000 mg | ORAL_TABLET | Freq: Every day | ORAL | Status: DC

## 2014-06-26 MED ORDER — CLOPIDOGREL BISULFATE 75 MG PO TABS: 75.0000 mg | ORAL_TABLET | Freq: Every day | ORAL | Status: DC

## 2014-06-26 MED ORDER — TRAZODONE HCL 50 MG PO TABS: 25.0000 mg | ORAL_TABLET | Freq: Every evening | ORAL | Status: DC | PRN

## 2014-06-26 MED ORDER — ZOSTER VACCINE LIVE 19400 UNT/0.65ML ~~LOC~~ SOLR: 0.6500 mL | Freq: Once | SUBCUTANEOUS | Status: DC

## 2014-06-26 MED ORDER — MIRTAZAPINE 15 MG PO TABS
15.0000 mg | ORAL_TABLET | Freq: Every day | ORAL | Status: DC
Start: 2014-06-26 — End: 2014-06-26

## 2014-06-26 NOTE — Progress Notes (Signed)
Patient ID: Shirley Gray, female   DOB: [birth year], 81 y.o.   MRN: [id number]   Location:  [facility]  Code Status: DNR  No Known Allergies  Chief Complaint  Patient presents with  . Medical Management of Chronic Issues    6 month follow-up, no recent labs (not fasting). Ongoing concerns: not sleeping all night     HPI: Patient is a 81 y.o. female seen in the office today for med mgt of chronic diseases.  She is not sleeping through the night.  Walks around in day and night.  Last week, she had one really bad night--Friday night--was folding clothes, going in drawers.  Then nods off during the day and eats.    Did not want to go back to bed the other night and was very clear then.    Much harder to understand lately unless she's upset or doesn't want to do something.    Now has 4 babies--twins over the holidays--feeds them, talks to them, pats them, etc.    The right arm feels tired sometimes (on right weak side--won't wear the sling).    Had a fall last month--called ems, didn't even get a bruise.  Happened at 5 am.  Gets up at 2-3am and dresses herself.   Fights sleeping after walking a lot.    Keeping her awake in the daytime to make her tired, still doesn't make her sleep at night.   Sleeps better if daughter also goes to bed at that time.  When she stays with her other daughter Britta Mccreedy), she's in her own room and is up and about.    No bleeding seen.  Sometimes has a little feces that leaked when wiped.  Has not needed prune juice in 2 mos.  Miralax not given daily b/c makes runny--used prn. They named the stools--sweet potato is a good one, baby ruth is hard and pt wants it pulled out.    Drinks cranberry juice and water now.    Review of Systems:  Review of Systems  Constitutional: Positive for weight loss. Negative for fever and malaise/fatigue.  HENT: Negative for congestion.   Eyes: Negative for blurred vision.    Respiratory: Negative for shortness of breath.   Cardiovascular: Negative for chest pain and leg swelling.  Gastrointestinal: Positive for constipation. Negative for abdominal pain, blood in stool and melena.       Intermittent constipation and hemorrhoidal bleeding  Genitourinary: Negative for dysuria.  Musculoskeletal: Positive for falls.  Skin: Negative for rash.  Neurological: Positive for sensory change and focal weakness. Negative for dizziness and weakness.  Endo/Heme/Allergies: Does not bruise/bleed easily.  Psychiatric/Behavioral: Positive for memory loss. Negative for depression. The patient has insomnia.      Past Medical History  Diagnosis Date  . Stroke   . Hyperlipidemia   . Confusion   . Alzheimer's dementia   . Hypertension   . Anemia   . Vascular dementia, uncomplicated   . Acute posthemorrhagic anemia   . Pain in joint, pelvic region and thigh   . Open fracture of unspecified part of neck of femur   . Pain in joint, pelvic region and thigh   . Unspecified constipation   . Urinary tract infection, site not specified   . Hemorrhage of rectum and anus   . Abnormality of gait   . Memory loss   . Dementia in conditions classified elsewhere without behavioral disturbance   . Myalgia and myositis, unspecified   .  Personal history of fall   . Other and unspecified hyperlipidemia   . Spastic hemiplegia affecting dominant side   . Peripheral vascular disease, unspecified   . Pain in joint, lower leg   . Osteoporosis, unspecified   . Unspecified urinary incontinence   . Unspecified essential hypertension   . Occlusion and stenosis of carotid artery without mention of cerebral infarction   . Unspecified late effects of cerebrovascular disease   . Allergic rhinitis due to pollen     Past Surgical History  Procedure Laterality Date  . Partial hip arthroplasty    . Abdominal hysterectomy      Social History:   reports that she has never smoked. She has never  used smokeless tobacco. She reports that she does not drink alcohol or use illicit drugs.  History reviewed. No pertinent family history.  Medications: Patient's Medications  New Prescriptions   No medications on file  Previous Medications   ACETAMINOPHEN (TYLENOL) 325 MG TABLET    Take 325 mg by mouth every 6 (six) hours as needed. For pain   CALCIUM CARB-CHOLECALCIFEROL (CALCIUM-VITAMIN D) 600-400 MG-UNIT TABS    Take 1 tablet by mouth daily.   CLOPIDOGREL (PLAVIX) 75 MG TABLET    TAKE 1 TABLET BY MOUTH DAILY   HYDROCORTISONE (ANUSOL-HC) 25 MG SUPPOSITORY    Place 1 suppository (25 mg total) rectally 2 (two) times daily.   MIRTAZAPINE (REMERON) 15 MG TABLET    Take 15 mg by mouth at bedtime. For Insomnia   MULTIPLE VITAMIN (MULITIVITAMIN WITH MINERALS) TABS    Take 1 tablet by mouth daily.   POLYETHYLENE GLYCOL (MIRALAX / GLYCOLAX) PACKET    Take 17 g by mouth daily as needed. For constipation   RALOXIFENE (EVISTA) 60 MG TABLET    TAKE 1 TABLET BY MOUTH DAILY   SIMVASTATIN (ZOCOR) 40 MG TABLET    Take one tablet by mouth every day for cholesterol   WITCH HAZEL (PREPARATION H EX)    Apply 1 application topically daily.  Modified Medications   No medications on file  Discontinued Medications   RALOXIFENE (EVISTA) 60 MG TABLET    Take 1 tablet (60 mg total) by mouth daily. For Osteoporosis   RALOXIFENE (EVISTA) 60 MG TABLET    Take 1 tablet (60 mg total) by mouth daily. For Osteoporosis     Physical Exam: Filed Vitals:   06/26/14 0924  BP: 112/70  Pulse: 77  Temp: 97.7 F (36.5 C)  TempSrc: Oral  Resp: 10  Height: 5\' 6"  (1.676 m)  Weight: 103 lb (46.72 kg)  Physical Exam  Constitutional:  Frail black female seated in transport chair  Cardiovascular: Normal rate, regular rhythm, normal heart sounds and intact distal pulses.   Pulmonary/Chest: Effort normal and breath sounds normal. No respiratory distress.  Abdominal: Soft. Bowel sounds are normal.  Neurological: She is  alert.  Dysarthria and aphasia from stroke; right hemiparesis, can ambulate with cane  Skin: Skin is warm and dry.  Psychiatric: She has a normal mood and affect.    Labs reviewed: Basic Metabolic Panel:  Recent Labs  09/81/1902/16/15 1054  NA 144  K 3.7  CL 106  CO2 26  GLUCOSE 69  BUN 13  CREATININE 0.74  CALCIUM 9.3   Liver Function Tests:  Recent Labs  07/18/13 1054  AST 23  ALT 19  ALKPHOS 47  BILITOT 0.3  PROT 5.9*  CBC:  Recent Labs  07/18/13 1054 12/22/13 0852  WBC 3.6 4.7  NEUTROABS 1.4 2.3  HGB 11.7 11.4  HCT 35.0 32.3*  MCV 96 96  PLT  --  234   Assessment/Plan 1. Insomnia - cont remeron also, but will try trazodone, too to help as needed and to get her back on schedule -discussed increased risk of falls with any sleep medications that are affordable - traZODone (DESYREL) 50 MG tablet; Take 0.5 tablets (25 mg total) by mouth at bedtime as needed for sleep.  Dispense: 45 tablet; Refill: 1  2. Vascular dementia, without behavioral disturbance -s/p prior stroke -is having more difficulty with sleep cycles -continues to lose weight due to walking all of the time -no longer on meds for dementia itself -remains on statin and plavix for secondary stroke prevention  3. Failure to thrive in adult -lost 2 more lbs since last visit in July (6 mos) despite eating a lot (she walks nearly continuously at home and I think that's a big part of it burning too many calories) - mirtazapine (REMERON) 15 MG tablet; Take 1 tablet (15 mg total) by mouth at bedtime. For Insomnia  Dispense: 90 tablet; Refill: 1  4. Senile osteoporosis -cont evista and ca with D, weightbearing exercise  5. Chronic constipation -cont miralax and prune juice when needed  6. History of stroke - has been back on plavix, statin with controlled lipids - clopidogrel (PLAVIX) 75 MG tablet; Take 1 tablet (75 mg total) by mouth daily.  Dispense: 90 tablet; Refill: 1  7. Hemorrhoids, unspecified  hemorrhoid type -cont anusol supps as needed, monitor for increased bleeding -reassess cbc due to plavix use  8.  Need for prevnar:  Given today  9.  Need for zostavax:  Given prescription for vaccine to obtain at pharmacy  Labs/tests ordered:   Orders Placed This Encounter  Procedures  . Pneumococcal conjugate vaccine 13-valent  . Basic metabolic panel  . CBC with Differential/Platelet    Next appt:  6 mos for annual exam  Lizann Edelman L. Astoria Condon, D.O. Geriatrics [facility] [address], [place] [address] Cell Phone (Mon-Fri 8am-5pm):  [phone] On Call:  [phone] & follow prompts after 5pm & weekends Office Phone:  [phone] Office Fax:  [phone]

## 2014-06-27 LAB — BASIC METABOLIC PANEL
BUN/Creatinine Ratio: 25 (ref 11–26)
BUN: 17 mg/dL (ref 8–27)
CO2: 26 mmol/L (ref 18–29)
Calcium: 9.3 mg/dL (ref 8.7–10.3)
Chloride: 106 mmol/L (ref 97–108)
Creatinine, Ser: 0.68 mg/dL (ref 0.57–1.00)
GFR calc Af Amer: 92 mL/min/{1.73_m2} (ref >59)
GFR calc non Af Amer: 80 mL/min/{1.73_m2} (ref >59)
Glucose: 73 mg/dL (ref 65–99)
Potassium: 4 mmol/L (ref 3.5–5.2)
Sodium: 144 mmol/L (ref 134–144)

## 2014-07-30 ENCOUNTER — Other Ambulatory Visit: Payer: Self-pay | Admitting: Internal Medicine

## 2014-08-04 ENCOUNTER — Telehealth: Payer: Self-pay | Admitting: *Deleted

## 2014-08-04 NOTE — Telephone Encounter (Signed)
Received [facility] Order for patient to receive Bone Density Appt: 08/15/2014 @9 :30. Given to Dr. Renato Gailseed to review and sign

## 2014-08-15 DIAGNOSIS — M810 Age-related osteoporosis without current pathological fracture: Secondary | ICD-10-CM | POA: Diagnosis not present

## 2014-08-15 LAB — HM DEXA SCAN

## 2014-08-22 ENCOUNTER — Encounter: Payer: Self-pay | Admitting: *Deleted

## 2014-08-28 ENCOUNTER — Telehealth: Payer: Self-pay

## 2014-08-28 NOTE — Telephone Encounter (Signed)
Spoke with daughter Shirley Gray about bone density, Dr. Renato Gailseed recommend she stop evista and begin prolia injections every 6 months. Explained the cost and Insurance to [name]. We will do insurance verification request and call her back with cost.

## 2014-09-21 ENCOUNTER — Other Ambulatory Visit: Payer: Self-pay | Admitting: Internal Medicine

## 2014-10-27 ENCOUNTER — Other Ambulatory Visit: Payer: Self-pay | Admitting: Internal Medicine

## 2015-01-01 ENCOUNTER — Encounter: Payer: Self-pay | Admitting: Internal Medicine

## 2015-01-01 ENCOUNTER — Ambulatory Visit (INDEPENDENT_AMBULATORY_CARE_PROVIDER_SITE_OTHER): Payer: Medicare Other | Admitting: Internal Medicine

## 2015-01-01 VITALS — BP 110/72 | HR 64 | Temp 97.4°F | Resp 20 | Ht 66.0 in | Wt 102.4 lb

## 2015-01-01 DIAGNOSIS — Z0000 Encounter for general adult medical examination without abnormal findings: Secondary | ICD-10-CM | POA: Diagnosis not present

## 2015-01-01 DIAGNOSIS — F0150 Vascular dementia without behavioral disturbance: Secondary | ICD-10-CM | POA: Diagnosis not present

## 2015-01-01 DIAGNOSIS — E785 Hyperlipidemia, unspecified: Secondary | ICD-10-CM | POA: Diagnosis not present

## 2015-01-01 DIAGNOSIS — I69991 Dysphagia following unspecified cerebrovascular disease: Secondary | ICD-10-CM | POA: Diagnosis not present

## 2015-01-01 DIAGNOSIS — I69920 Aphasia following unspecified cerebrovascular disease: Secondary | ICD-10-CM | POA: Diagnosis not present

## 2015-01-01 DIAGNOSIS — K5901 Slow transit constipation: Secondary | ICD-10-CM

## 2015-01-01 DIAGNOSIS — R627 Adult failure to thrive: Secondary | ICD-10-CM | POA: Diagnosis not present

## 2015-01-01 DIAGNOSIS — I69320 Aphasia following cerebral infarction: Secondary | ICD-10-CM

## 2015-01-01 DIAGNOSIS — R3981 Functional urinary incontinence: Secondary | ICD-10-CM | POA: Diagnosis not present

## 2015-01-01 DIAGNOSIS — K648 Other hemorrhoids: Secondary | ICD-10-CM | POA: Diagnosis not present

## 2015-01-01 DIAGNOSIS — M810 Age-related osteoporosis without current pathological fracture: Secondary | ICD-10-CM

## 2015-01-01 MED ORDER — SENNOSIDES-DOCUSATE SODIUM 8.6-50 MG PO TABS: 1.0000 | ORAL_TABLET | Freq: Every day | ORAL | Status: DC

## 2015-01-01 NOTE — Progress Notes (Signed)
Patient ID: Shirley Gray, female   DOB: 03/17/1927, 81 y.o.   MRN: [id number]   Location:  [facility]  Code Status: DNR Goals of Care: Advanced Directive information Does patient have an advance directive?: Yes, Type of Advance Directive: Healthcare Power of [place];Living will;Out of facility DNR (pink MOST or yellow form), Pre-existing out of facility DNR order (yellow form or pink MOST form): Yellow form placed in chart (order not valid for inpatient use), Does patient want to make changes to advanced directive?: No - Patient declined   Chief Complaint  Patient presents with  . Annual Exam    pain in right leg and hip    HPI: Patient is a 81 y.o. black female seen in the office today for her annual exam. She also c/o pain in her right leg and hip  Few weeks ago, right knee was hurting.  She took some tylenol and her daughter rubbed it and it seemed to get better.  Now she c/o right hip pain this am.  Denies any pain now.  Will get miralax every other days, then goes poop, poop, poop and then loose with leakage/incontinence.  If does every 3 days, then it gets hard and impacted.  No bm now in last two days.      MMSE - Mini Mental State Exam 01/01/2015  Not completed: Unable to complete   Depression screen [facility] 2/9 01/01/2015 04/14/2013 09/20/2012  Decreased Interest 0 0 0  Down, Depressed, Hopeless 0 1 -  PHQ - 2 Score 0 1 0  Altered sleeping - 3 -  Tired, decreased energy - 1 -  Change in appetite - 0 -  Feeling bad or failure about yourself  - 1 -  Trouble concentrating - 1 -  Moving slowly or fidgety/restless - 1 -  Suicidal thoughts - 0 -  PHQ-9 Score - 8 -    Fall Risk  01/01/2015 06/26/2014 12/22/2013 04/14/2013 09/20/2012  Falls in the past year? No Yes Yes Yes No  Number falls in past yr: - 1 1 1  -  Injury with Fall? - No No No -  Risk for fall due to : History of fall(s);Impaired balance/gait;Impaired mobility;Mental status  change - - - -   Immunization History  Administered Date(s) Administered  . Influenza,inj,Quad PF,36+ Mos 03/15/2013  . Influenza-Unspecified 03/01/2014  . Pneumococcal Conjugate-13 06/26/2014  . Pneumococcal Polysaccharide-23 06/02/2012  . Td 07/27/2013  . Zoster 07/22/2014   Last bone density was 08/15/14 at [facility] and showed severe osteoporosis with T score of -4.3.  I recommended she stop evista and begin prolia shots twice a year, but her family opted against this change.  Functional status:  Is able to dress herself and feed herself and toilet herself, but does need help with bathing and does better with help.    Exercise:  Walks around her home all day using cane. Her daughter requested a prescription for a new right foot half brace to replace the one they currently have.  Old one is too large due to atrophy of muscles and weight loss.   Review of Systems:  Review of Systems  Constitutional: Positive for weight loss. Negative for fever, chills and malaise/fatigue.  HENT: Negative for congestion and hearing loss.   Eyes: Negative for blurred vision.  Respiratory: Negative for shortness of breath.   Cardiovascular: Negative for chest pain and leg swelling.  Gastrointestinal: Negative for abdominal pain.  Genitourinary: Positive for urgency. Negative  for dysuria and frequency.       Incontinence  Musculoskeletal: Negative for falls.  Skin: Negative for itching and rash.  Neurological: Positive for sensory change, speech change, focal weakness and weakness. Negative for dizziness and loss of consciousness.       Right hemiparesis and aphasia   Psychiatric/Behavioral: Positive for memory loss. Negative for depression.    Past Medical History  Diagnosis Date  . Stroke   . Hyperlipidemia   . Confusion   . Alzheimer's dementia   . Hypertension   . Anemia   . Vascular dementia, uncomplicated   . Acute posthemorrhagic anemia   . Pain in joint, pelvic region and thigh   . Open  fracture of unspecified part of neck of femur   . Pain in joint, pelvic region and thigh   . Unspecified constipation   . Urinary tract infection, site not specified   . Hemorrhage of rectum and anus   . Abnormality of gait   . Memory loss   . Dementia in conditions classified elsewhere without behavioral disturbance   . Myalgia and myositis, unspecified   . Personal history of fall   . Other and unspecified hyperlipidemia   . Spastic hemiplegia affecting dominant side   . Peripheral vascular disease, unspecified   . Pain in joint, lower leg   . Osteoporosis, unspecified   . Unspecified urinary incontinence   . Unspecified essential hypertension   . Occlusion and stenosis of carotid artery without mention of cerebral infarction   . Unspecified late effects of cerebrovascular disease   . Allergic rhinitis due to pollen     Past Surgical History  Procedure Laterality Date  . Partial hip arthroplasty    . Abdominal hysterectomy     History   Social History  . Marital Status: Married    Spouse Name: N/A  . Number of Children: N/A  . Years of Education: N/A   Occupational History  . Not on file.   Social History Main Topics  . Smoking status: Never Smoker   . Smokeless tobacco: Never Used  . Alcohol Use: No  . Drug Use: No  . Sexual Activity: No   Other Topics Concern  . Not on file   Social History Narrative   No family history on file.  No Known Allergies Medications: Patient's Medications  New Prescriptions   No medications on file  Previous Medications   ACETAMINOPHEN (TYLENOL) 325 MG TABLET    Take 325 mg by mouth every 6 (six) hours as needed. For pain   CALCIUM CARB-CHOLECALCIFEROL (CALCIUM-VITAMIN D) 600-400 MG-UNIT TABS    Take 1 tablet by mouth daily.   CLOPIDOGREL (PLAVIX) 75 MG TABLET    Take 1 tablet (75 mg total) by mouth daily.   HYDROCORTISONE (ANUSOL-HC) 25 MG SUPPOSITORY    Place 1 suppository (25 mg total) rectally 2 (two) times daily.    MIRTAZAPINE (REMERON) 15 MG TABLET    TAKE 1 TABLET BY MOUTH AT BEDTIME   MULTIPLE VITAMIN (MULITIVITAMIN WITH MINERALS) TABS    Take 1 tablet by mouth daily.   POLYETHYLENE GLYCOL (MIRALAX / GLYCOLAX) PACKET    Take 17 g by mouth daily as needed. For constipation   RALOXIFENE (EVISTA) 60 MG TABLET    TAKE 1 TABLET BY MOUTH EVERY DAY   SIMVASTATIN (ZOCOR) 40 MG TABLET    Take one tablet by mouth every day for cholesterol   WITCH HAZEL (PREPARATION H EX)    Apply 1 application  topically daily.   ZOSTER VACCINE LIVE, PF, (ZOSTAVAX) 19147 UNT/0.65ML INJECTION    Inject 19,400 Units into the skin once.  Modified Medications   No medications on file  Discontinued Medications   TRAZODONE (DESYREL) 50 MG TABLET    Take 0.5 tablets (25 mg total) by mouth at bedtime as needed for sleep.    Physical Exam: Filed Vitals:   01/01/15 1348  BP: 110/72  Pulse: 64  Temp: 97.4 F (36.3 C)  TempSrc: Oral  Resp: 20  Height: 5\' 6"  (1.676 m)  Weight: 102 lb 6.4 oz (46.448 kg)  SpO2: 96%   Physical Exam  Constitutional:  Frail black female seated in wheelchair and then moved to exam table  HENT:  Head: Normocephalic and atraumatic.  Right Ear: External ear normal.  Left Ear: External ear normal.  Nose: Nose normal.  Mouth/Throat: Oropharynx is clear and moist. No oropharyngeal exudate.  Facial droop  Eyes: Conjunctivae and EOM are normal. Pupils are equal, round, and reactive to light.  Neck: Normal range of motion. Neck supple.  Cardiovascular: Intact distal pulses.   irreg irreg  Pulmonary/Chest: Effort normal and breath sounds normal.  Would not allow her chest exposed so exam had to be performed on top of the gown--no grossly palpable masses of breasts  Abdominal: Soft. Bowel sounds are normal. She exhibits no distension. There is no tenderness.  Neurological: She is alert. No cranial nerve deficit.  Dysarthric and aphasic  Skin: Skin is warm and dry.  Psychiatric: She has a normal mood  and affect.    Labs reviewed: Basic Metabolic Panel:  Recent Labs  82/95/62 1032  NA 144  K 4.0  CL 106  CO2 26  GLUCOSE 73  BUN 17  CREATININE 0.68  CALCIUM 9.3   Assessment/Plan 1. Medicare annual wellness visit, subsequent -see details in HPI  2. Vascular dementia, without behavioral disturbance -has had some progression over the past year with worsening speech, more weight loss despite eating quite a bit -still ambulates with her cane unsteadily on her own at home against safety recommendations  3. Aphasia as late effect of cerebrovascular accident -has gotten worse -cont secondary prevention with plavix; bp is great, and goals of care are not to aggressively manage her cholesterol--is on zocor  4. Dysphagia as late effect of cerebrovascular disease -cont modified diet, but eats quite well with remeron and meals prepped for her  5. Functional urinary incontinence -ongoing; is getting slower moving so it happens more often -wears depends  6. Senile osteoporosis -continues on evista though I recommend switching to prolia to protect her bones better b/c she is a very high fall risk  7. Failure to thrive in adult - weight loss, debility progressing due to her dementia -cont 24 hr care at home by her daughter with supplements, multiple small meals, snacks  8. Internal hemorrhoid, bleeding -cont anusol supp and cream as needed -also benefited from witch hazel during a previous episode  9. Slow transit constipation - cont miralax and senna s for this - senna-docusate (SENOKOT-S) 8.6-50 MG per tablet; Take 1 tablet by mouth daily.  Dispense: 30 tablet; Refill: 3  Labs/tests ordered: Orders Placed This Encounter  Procedures  . CBC with Differential/Platelet  . Comprehensive metabolic panel    Order Specific Question:  Has the patient fasted?    Answer:  Yes  . Lipid panel    Order Specific Question:  Has the patient fasted?    Answer:  Yes  Next appt:  4 mos  for med mgt  Tkai Large L. Mannix Kroeker, D.O. Geriatrics [facility] [address], [place] [address] Cell Phone (Mon-Fri 8am-5pm):  [phone] On Call:  [phone] & follow prompts after 5pm & weekends Office Phone:  [phone] Office Fax:  [phone]

## 2015-01-01 NOTE — Patient Instructions (Signed)
Debrox drops for ear wax.   Senokot for bowels daily.

## 2015-01-02 ENCOUNTER — Telehealth: Payer: Self-pay | Admitting: *Deleted

## 2015-01-02 LAB — COMPREHENSIVE METABOLIC PANEL
ALT: 18 IU/L (ref 0–32)
AST: 22 IU/L (ref 0–40)
Albumin/Globulin Ratio: 1.8 (ref 1.1–2.5)
Albumin: 4.1 g/dL (ref 3.5–4.7)
Alkaline Phosphatase: 47 IU/L (ref 39–117)
BUN/Creatinine Ratio: 26 (ref 11–26)
BUN: 18 mg/dL (ref 8–27)
Bilirubin Total: 0.3 mg/dL (ref 0.0–1.2)
CO2: 24 mmol/L (ref 18–29)
Calcium: 9.6 mg/dL (ref 8.7–10.3)
Chloride: 104 mmol/L (ref 97–108)
Creatinine, Ser: 0.69 mg/dL (ref 0.57–1.00)
GFR calc Af Amer: 92 mL/min/{1.73_m2} (ref >59)
GFR calc non Af Amer: 80 mL/min/{1.73_m2} (ref >59)
Globulin, Total: 2.3 g/dL (ref 1.5–4.5)
Glucose: 91 mg/dL (ref 65–99)
Potassium: 4.1 mmol/L (ref 3.5–5.2)
Sodium: 145 mmol/L — ABNORMAL HIGH (ref 134–144)
Total Protein: 6.4 g/dL (ref 6.0–8.5)

## 2015-01-02 LAB — CBC WITH DIFFERENTIAL/PLATELET
Basophils Absolute: 0 10*3/uL (ref 0.0–0.2)
Basos: 0 %
EOS (ABSOLUTE): 0 10*3/uL (ref 0.0–0.4)
Eos: 1 %
Hematocrit: 35.7 % (ref 34.0–46.6)
Hemoglobin: 12.1 g/dL (ref 11.1–15.9)
Immature Grans (Abs): 0 10*3/uL (ref 0.0–0.1)
Immature Granulocytes: 0 %
Lymphocytes Absolute: 2.1 10*3/uL (ref 0.7–3.1)
Lymphs: 42 %
MCH: 32.5 pg (ref 26.6–33.0)
MCHC: 33.9 g/dL (ref 31.5–35.7)
MCV: 96 fL (ref 79–97)
Monocytes Absolute: 0.6 10*3/uL (ref 0.1–0.9)
Monocytes: 12 %
Neutrophils Absolute: 2.2 10*3/uL (ref 1.4–7.0)
Neutrophils: 45 %
Platelets: 247 10*3/uL (ref 150–379)
RBC: 3.72 x10E6/uL — ABNORMAL LOW (ref 3.77–5.28)
RDW: 13 % (ref 12.3–15.4)
WBC: 4.9 10*3/uL (ref 3.4–10.8)

## 2015-01-02 LAB — LIPID PANEL
Chol/HDL Ratio: 2.5 ratio units (ref 0.0–4.4)
Cholesterol, Total: 150 mg/dL (ref 100–199)
HDL: 60 mg/dL (ref >39)
LDL Calculated: 78 mg/dL (ref 0–99)
Triglycerides: 59 mg/dL (ref 0–149)
VLDL Cholesterol Cal: 12 mg/dL (ref 5–40)

## 2015-01-02 NOTE — Telephone Encounter (Signed)
Received Engineer, manufacturing Form for Ankle Foot orthosis due to foot drop right foot. Given to Dr. Renato Gails to review and sign. Printed OV notes and attached. [employer] [phone] Fax: [phone]

## 2015-01-30 ENCOUNTER — Other Ambulatory Visit: Payer: Self-pay | Admitting: Internal Medicine

## 2015-03-07 ENCOUNTER — Other Ambulatory Visit: Payer: Self-pay | Admitting: Internal Medicine

## 2015-03-29 ENCOUNTER — Telehealth: Payer: Self-pay

## 2015-03-29 DIAGNOSIS — G8190 Hemiplegia, unspecified affecting unspecified side: Secondary | ICD-10-CM

## 2015-03-29 DIAGNOSIS — F0150 Vascular dementia without behavioral disturbance: Secondary | ICD-10-CM

## 2015-03-29 DIAGNOSIS — R54 Age-related physical debility: Secondary | ICD-10-CM

## 2015-03-29 NOTE — Telephone Encounter (Signed)
Agree with stair lift.  Dx:  Stroke with hemiparesis, frailty, vascular dementia

## 2015-03-29 NOTE — Telephone Encounter (Signed)
Patient was provided a stair lift yesterday through [facility]. Patient needs rx in order for stair lift to be covered. Total price was 4117.96 Please provide order and diagnosis. Order will be faxed to [phone]

## 2015-03-30 ENCOUNTER — Other Ambulatory Visit: Payer: Self-pay | Admitting: Internal Medicine

## 2015-03-30 MED ORDER — AMBULATORY NON FORMULARY MEDICATION: Status: DC

## 2015-03-30 NOTE — Telephone Encounter (Signed)
Order printed and faxed.

## 2015-03-30 NOTE — Telephone Encounter (Signed)
I called [name] and [name] to informed them that request was complete

## 2015-04-04 DIAGNOSIS — Z23 Encounter for immunization: Secondary | ICD-10-CM | POA: Diagnosis not present

## 2015-04-20 ENCOUNTER — Other Ambulatory Visit: Payer: Self-pay | Admitting: Internal Medicine

## 2015-04-28 ENCOUNTER — Other Ambulatory Visit: Payer: Self-pay | Admitting: Internal Medicine

## 2015-05-04 ENCOUNTER — Ambulatory Visit (INDEPENDENT_AMBULATORY_CARE_PROVIDER_SITE_OTHER): Payer: Medicare Other | Admitting: Internal Medicine

## 2015-05-04 ENCOUNTER — Encounter: Payer: Self-pay | Admitting: Internal Medicine

## 2015-05-04 VITALS — BP 106/60 | HR 68 | Temp 97.4°F

## 2015-05-04 DIAGNOSIS — K5900 Constipation, unspecified: Secondary | ICD-10-CM | POA: Diagnosis not present

## 2015-05-04 DIAGNOSIS — Z8673 Personal history of transient ischemic attack (TIA), and cerebral infarction without residual deficits: Secondary | ICD-10-CM | POA: Diagnosis not present

## 2015-05-04 DIAGNOSIS — M810 Age-related osteoporosis without current pathological fracture: Secondary | ICD-10-CM

## 2015-05-04 DIAGNOSIS — F0150 Vascular dementia without behavioral disturbance: Secondary | ICD-10-CM | POA: Diagnosis not present

## 2015-05-04 DIAGNOSIS — K649 Unspecified hemorrhoids: Secondary | ICD-10-CM

## 2015-05-04 DIAGNOSIS — K5909 Other constipation: Secondary | ICD-10-CM

## 2015-05-04 NOTE — Progress Notes (Signed)
Patient ID: Shirley Gray, female   DOB: [birth year], 81 y.o.   MRN: [id number]   Location: [facility] Provider: Gwenith Spitziffany L. Renato Gailseed, D.O., C.M.D.  Code Status: DNR Goals of Care: Advanced Directive information Does patient have an advance directive?: Yes, Type of Advance Directive: Healthcare Power of Attorney Discussed that if emergency occurs, to call our office to try to avoid putting her through a hospitalization with her late stage dementia (would not tolerate a hospitalization well)  Chief Complaint  Patient presents with  . Medical Management of Chronic Issues    4 month check on Alzheimers, blood pressure, anemia, cholesterol. Here with daughter [name]    HPI: Patient is a 81 y.o. female seen in the office today for med mgt of chronic diseases.    She has had a prior stroke with right hemiparesis.  She walks with a cane at home and come here in a wheelchair.  She denies hurting.  She is on plavix for secondary prevention.  She talks all through the visit to other people not there.    She fell once, partially, but didn't hit the floor--fell backwards.  She is always moving around.    No rectal bleeding.  Hemorrhoids have not been hanging out.  Bowels are moving regularly--takes miralax every 3 days.  If stool hard, they add prune juice to it.    Now grunts regularly.  Says oh lord when the news comes on lately.  Seems habitual.    She sleeps a little more at night.  Sometimes up to urinate at 3, wakes back up at 4.  Less walking at night than she used to.     Eats all of the time and has no meat on her bones.  Was 102 lbs last time.    She's become more stubborn--will resist movement, wouldn't shower and wouldn't weigh.    Osteoporosis:  evista and calcium with D chewables--chocolate and vanilla.   Gets petite vitamins.  Review of Systems:  Review of Systems  Constitutional: Negative for fever and chills.       Unsure if she's lost weight--would not agree to weight   HENT: Negative for congestion.   Respiratory: Negative for cough and shortness of breath.   Cardiovascular: Negative for chest pain and leg swelling.  Gastrointestinal: Positive for constipation. Negative for abdominal pain, blood in stool and melena.       Hemorrhoids  Genitourinary: Negative for dysuria.  Musculoskeletal: Positive for falls.  Skin: Negative for rash.  Neurological: Positive for sensory change and focal weakness. Negative for dizziness and loss of consciousness.       Dysphagia, right hemiparesis  Psychiatric/Behavioral: Positive for hallucinations and memory loss. Negative for depression. The patient has insomnia.        Wanders around house at night    Past Medical History  Diagnosis Date  . Stroke (HCC)   . Hyperlipidemia   . Confusion   . Alzheimer's dementia   . Hypertension   . Anemia   . Vascular dementia, uncomplicated   . Acute posthemorrhagic anemia   . Pain in joint, pelvic region and thigh   . Open fracture of unspecified part of neck of femur   . Pain in joint, pelvic region and thigh   . Unspecified constipation   . Urinary tract infection, site not specified   . Hemorrhage of rectum and anus   . Abnormality of gait   . Memory loss   . Dementia in conditions classified elsewhere  without behavioral disturbance   . Myalgia and myositis, unspecified   . Personal history of fall   . Other and unspecified hyperlipidemia   . Spastic hemiplegia affecting dominant side (HCC)   . Peripheral vascular disease, unspecified (HCC)   . Pain in joint, lower leg   . Osteoporosis, unspecified   . Unspecified urinary incontinence   . Unspecified essential hypertension   . Occlusion and stenosis of carotid artery without mention of cerebral infarction   . Unspecified late effects of cerebrovascular disease   . Allergic rhinitis due to pollen     Past Surgical History  Procedure Laterality Date  . Partial hip arthroplasty    . Abdominal hysterectomy       No Known Allergies    Medication List       This list is accurate as of: 05/04/15 11:27 AM.  Always use your most recent med list.               acetaminophen 325 MG tablet  Commonly known as:  TYLENOL  Take 325 mg by mouth every 6 (six) hours as needed. For pain     AMBULATORY NON FORMULARY MEDICATION  Stair Lift, Dx: Stroke with hemiparesis, frailty, vascular dementia     Calcium-Vitamin D 600-400 MG-UNIT Tabs  Take 1 tablet by mouth daily.     clopidogrel 75 MG tablet  Commonly known as:  PLAVIX  TAKE 1 TABLET BY MOUTH EVERY DAY     hydrocortisone 25 MG suppository  Commonly known as:  ANUSOL-HC  Place 1 suppository (25 mg total) rectally 2 (two) times daily.     mirtazapine 15 MG tablet  Commonly known as:  REMERON  TAKE 1 TABLET BY MOUTH AT BEDTIME     multivitamin with minerals Tabs tablet  Take 1 tablet by mouth daily.     polyethylene glycol packet  Commonly known as:  MIRALAX / GLYCOLAX  Take 17 g by mouth daily as needed. For constipation     PREPARATION H EX  Apply 1 application topically daily.     raloxifene 60 MG tablet  Commonly known as:  EVISTA  TAKE 1 TABLET BY MOUTH EVERY DAY     senna-docusate 8.6-50 MG tablet  Commonly known as:  Senokot-S  Take 1 tablet by mouth daily.     simvastatin 40 MG tablet  Commonly known as:  ZOCOR  TAKE 1 TABLET BY MOUTH EVERY DAY FOR CHOLESTEROL        Health Maintenance  Topic Date Due  . INFLUENZA VACCINE  01/01/2015  . TETANUS/TDAP  07/28/2023  . DEXA SCAN  Completed  . ZOSTAVAX  Completed  . PNA vac Low Risk Adult  Completed    Physical Exam: Filed Vitals:   05/04/15 1107  BP: 106/60  Pulse: 68  Temp: 97.4 F (36.3 C)  TempSrc: Oral  SpO2: 96%   There is no weight on file to calculate BMI. Physical Exam  Constitutional: No distress.  sarcopenic   Cardiovascular: Normal rate, regular rhythm and normal heart sounds.   Pt talking through exam of heart and lungs and daughter and  myself unable to get her to stop talking  Pulmonary/Chest: Effort normal and breath sounds normal.  Abdominal: Soft. Bowel sounds are normal. She exhibits no distension. There is no tenderness.  Musculoskeletal:  Right hemiparesis; has right foot drop with brace; facial droop and dysphasia  Neurological: She is alert.  Skin: Skin is warm and dry.  Psychiatric:  Laughing and  joking     Labs reviewed: Basic Metabolic Panel:  Recent Labs  16/10/96 1032 01/01/15 1453  NA 144 145*  K 4.0 4.1  CL 106 104  CO2 26 24  GLUCOSE 73 91  BUN 17 18  CREATININE 0.68 0.69  CALCIUM 9.3 9.6   Liver Function Tests:  Recent Labs  01/01/15 1453  AST 22  ALT 18  ALKPHOS 47  BILITOT 0.3  PROT 6.4  ALBUMIN 4.1   No results for input(s): LIPASE, AMYLASE in the last 8760 hours. No results for input(s): AMMONIA in the last 8760 hours. CBC:  Recent Labs  01/01/15 1453  WBC 4.9  NEUTROABS 2.2  HCT 35.7   Lipid Panel:  Recent Labs  01/01/15 1453  CHOL 150  HDL 60  LDLCALC 78  TRIG 59  CHOLHDL 2.5   No results found for: HGBA1C  Assessment/Plan 1. History of stroke With right hemiparesis  -continues to live at home with family caregiver and a few times a week additional CNA to help with showers which she sometimes refuses (dementia) -continues on plavix for ischemic stroke prevention  2. Vascular dementia, without behavioral disturbance -pretty severe with hallucinations -usually pleasant but occasionally refuses things -not on meds for this due to advanced disease and trying to avoid adding meds with her dysphagia  3. Senile osteoporosis -cont evista and ca with D to try to prevent fractures, last bone density was 3/16  4. Chronic constipation -cont miralax with prune juice every three days as needed   5. Hemorrhoids, unspecified hemorrhoid type -not problematic with regular bowel regimen  Labs/tests ordered:  No labs due to goals of care being comfort and trying  to keep pt at home  Next appt:  5 mos per family request  Courtland Coppa L. Hubbert Landrigan, D.O. Geriatrics [facility] [address], [place] [address] Cell Phone (Mon-Fri 8am-5pm):  [phone] On Call:  [phone] & follow prompts after 5pm & weekends Office Phone:  [phone] Office Fax:  [phone]

## 2015-06-26 ENCOUNTER — Other Ambulatory Visit: Payer: Self-pay | Admitting: Internal Medicine

## 2015-07-17 ENCOUNTER — Other Ambulatory Visit: Payer: Self-pay | Admitting: Internal Medicine

## 2015-07-25 ENCOUNTER — Ambulatory Visit (INDEPENDENT_AMBULATORY_CARE_PROVIDER_SITE_OTHER): Payer: Medicare Other | Admitting: Podiatry

## 2015-07-25 DIAGNOSIS — M79674 Pain in right toe(s): Secondary | ICD-10-CM

## 2015-07-25 DIAGNOSIS — L84 Corns and callosities: Secondary | ICD-10-CM | POA: Diagnosis not present

## 2015-07-25 DIAGNOSIS — B351 Tinea unguium: Secondary | ICD-10-CM

## 2015-07-25 DIAGNOSIS — M79605 Pain in left leg: Secondary | ICD-10-CM

## 2015-07-25 DIAGNOSIS — M79675 Pain in left toe(s): Secondary | ICD-10-CM | POA: Diagnosis not present

## 2015-07-25 DIAGNOSIS — M79604 Pain in right leg: Secondary | ICD-10-CM

## 2015-07-26 NOTE — Progress Notes (Signed)
Subjective:     Patient ID: Shirley Gray, female   DOB: 05-22-1927, 82 y.o.   MRN: [id number]  HPI patient presents with nail disease 1-5 both feet and lesion second toe left it's painful. She does present with caregiver   Review of Systems     Objective:   Physical Exam Neurovascular status significantly diminished with patient having dementia and found to have nail disease 1-5 both feet that are thick and become painful and impossible for her caregiver to cut along with keratotic lesion second toe left    Assessment:     Mycotic nail infection with lesion formation    Plan:     Debride painful nailbeds 1-5 both feet and lesions with no iatrogenic bleeding noted

## 2015-08-22 ENCOUNTER — Other Ambulatory Visit: Payer: Self-pay | Admitting: Internal Medicine

## 2015-09-03 ENCOUNTER — Emergency Department (HOSPITAL_COMMUNITY): Payer: Medicare Other

## 2015-09-03 ENCOUNTER — Emergency Department (HOSPITAL_COMMUNITY)
Admission: EM | Admit: 2015-09-03 | Discharge: 2015-09-03 | Disposition: A | Discharge status: Home / Self Care | Payer: Medicare Other | Attending: Emergency Medicine | Admitting: Emergency Medicine

## 2015-09-03 DIAGNOSIS — Z87440 Personal history of urinary (tract) infections: Secondary | ICD-10-CM | POA: Insufficient documentation

## 2015-09-03 DIAGNOSIS — Z862 Personal history of diseases of the blood and blood-forming organs and certain disorders involving the immune mechanism: Secondary | ICD-10-CM | POA: Insufficient documentation

## 2015-09-03 DIAGNOSIS — Z79899 Other long term (current) drug therapy: Secondary | ICD-10-CM | POA: Insufficient documentation

## 2015-09-03 DIAGNOSIS — I10 Essential (primary) hypertension: Secondary | ICD-10-CM | POA: Diagnosis not present

## 2015-09-03 DIAGNOSIS — S4991XA Unspecified injury of right shoulder and upper arm, initial encounter: Secondary | ICD-10-CM | POA: Diagnosis not present

## 2015-09-03 DIAGNOSIS — G309 Alzheimer's disease, unspecified: Secondary | ICD-10-CM | POA: Diagnosis not present

## 2015-09-03 DIAGNOSIS — M810 Age-related osteoporosis without current pathological fracture: Secondary | ICD-10-CM | POA: Insufficient documentation

## 2015-09-03 DIAGNOSIS — Y998 Other external cause status: Secondary | ICD-10-CM | POA: Insufficient documentation

## 2015-09-03 DIAGNOSIS — M25511 Pain in right shoulder: Secondary | ICD-10-CM

## 2015-09-03 DIAGNOSIS — Z8673 Personal history of transient ischemic attack (TIA), and cerebral infarction without residual deficits: Secondary | ICD-10-CM | POA: Insufficient documentation

## 2015-09-03 DIAGNOSIS — E785 Hyperlipidemia, unspecified: Secondary | ICD-10-CM | POA: Insufficient documentation

## 2015-09-03 DIAGNOSIS — F0280 Dementia in other diseases classified elsewhere without behavioral disturbance: Secondary | ICD-10-CM | POA: Insufficient documentation

## 2015-09-03 DIAGNOSIS — Z7952 Long term (current) use of systemic steroids: Secondary | ICD-10-CM | POA: Diagnosis not present

## 2015-09-03 DIAGNOSIS — K5900 Constipation, unspecified: Secondary | ICD-10-CM | POA: Diagnosis not present

## 2015-09-03 DIAGNOSIS — Z7902 Long term (current) use of antithrombotics/antiplatelets: Secondary | ICD-10-CM | POA: Diagnosis not present

## 2015-09-03 DIAGNOSIS — M25519 Pain in unspecified shoulder: Secondary | ICD-10-CM | POA: Diagnosis not present

## 2015-09-03 DIAGNOSIS — Y9289 Other specified places as the place of occurrence of the external cause: Secondary | ICD-10-CM | POA: Diagnosis not present

## 2015-09-03 DIAGNOSIS — W1839XA Other fall on same level, initial encounter: Secondary | ICD-10-CM | POA: Diagnosis not present

## 2015-09-03 DIAGNOSIS — Y9389 Activity, other specified: Secondary | ICD-10-CM | POA: Diagnosis not present

## 2015-09-03 MED ORDER — LORAZEPAM 2 MG/ML IJ SOLN
0.5000 mg | Freq: Once | INTRAMUSCULAR | Status: AC
  Administered 2015-09-03: 0.5 mg via INTRAVENOUS
  Filled 2015-09-03: qty 1

## 2015-09-03 NOTE — Progress Notes (Signed)
CSW attempted to speak with patient. However, son states patient is not communicative. Son states that patient has a history of Alzheimers.  Son and daughter confirm that [facility]. Daughter states that the patient uses a cane to ambulate but was no using her cane during the incident. She states patient was trying to make it to the table. Son states that the patient's R side is paralyzed.   Daughter informed CSW that patient does not fall often, and that this incident is her first time falling within a litter over a year. Children state that patient has a good support system. They informed CSW that patient alternates between staying at her Childrens home.  Daughter/Shirley Gray [phone]  Trish MageBrittney Zhane Gray, LCSWA [phone] ED CSW 09/03/2015 10:21 PM

## 2015-09-03 NOTE — ED Provider Notes (Signed)
CSN: [id number]     Arrival date & time 09/03/15  2032 History   First MD Initiated Contact with Patient 09/03/15 2100     Chief Complaint  Patient presents with  . Fall   (Consider location/radiation/quality/duration/timing/severity/associated sxs/prior Treatment) HPI 82 y.o. female with a hx of Dementia, HTN, presents to the Emergency Department today due to right shoulder pain after mechanical fall sustained earlier today. Per family, the patient was trying to move a table when she lost her balance and hit her right shoulder against the table. Patient holding right arm. Acting baseline per family. Notes chronic dislocated right shoulder for 15 years that remains in a sling. States that they have seen providers for the shoulder due to dislocation, but keeps "popping out." States she just wears a sling at all times. Family concern is with fracture of affected area.   Level V Caveat: Dementia   Past Medical History  Diagnosis Date  . Stroke (HCC)   . Hyperlipidemia   . Confusion   . Alzheimer's dementia   . Hypertension   . Anemia   . Vascular dementia, uncomplicated   . Acute posthemorrhagic anemia   . Pain in joint, pelvic region and thigh   . Open fracture of unspecified part of neck of femur   . Pain in joint, pelvic region and thigh   . Unspecified constipation   . Urinary tract infection, site not specified   . Hemorrhage of rectum and anus   . Abnormality of gait   . Memory loss   . Dementia in conditions classified elsewhere without behavioral disturbance   . Myalgia and myositis, unspecified   . Personal history of fall   . Other and unspecified hyperlipidemia   . Spastic hemiplegia affecting dominant side (HCC)   . Peripheral vascular disease, unspecified (HCC)   . Pain in joint, lower leg   . Osteoporosis, unspecified   . Unspecified urinary incontinence   . Unspecified essential hypertension   . Occlusion and stenosis of carotid artery without mention of cerebral  infarction   . Unspecified late effects of cerebrovascular disease   . Allergic rhinitis due to pollen    Past Surgical History  Procedure Laterality Date  . Partial hip arthroplasty    . Abdominal hysterectomy     No family history on file. Social History  Substance Use Topics  . Smoking status: Never Smoker   . Smokeless tobacco: Never Used  . Alcohol Use: No   OB History    No data available     Review of Systems  Unable to perform ROS: Dementia   Allergies  Review of patient's allergies indicates no known allergies.  Home Medications   Prior to Admission medications   Medication Sig Start Date End Date Taking? Authorizing Provider  acetaminophen (TYLENOL) 325 MG tablet Take 325 mg by mouth every 6 (six) hours as needed. For pain    Historical Provider, MD  AMBULATORY NON FORMULARY MEDICATION Stair Lift, Dx: Stroke with hemiparesis, frailty, vascular dementia 03/30/15   Tiffany L Reed, DO  Calcium Carb-Cholecalciferol (CALCIUM-VITAMIN D) 600-400 MG-UNIT TABS Take 1 tablet by mouth daily.    Historical Provider, MD  clopidogrel (PLAVIX) 75 MG tablet TAKE 1 TABLET BY MOUTH EVERY DAY 04/30/15   Tiffany L Reed, DO  hydrocortisone (ANUSOL-HC) 25 MG suppository Place 1 suppository (25 mg total) rectally 2 (two) times daily. 12/15/13   Melissa Smith, PA-C  mirtazapine (REMERON) 15 MG tablet TAKE 1 TABLET BY MOUTH AT BEDTIME  07/17/15   Tiffany L Reed, DO  Multiple Vitamin (MULITIVITAMIN WITH MINERALS) TABS Take 1 tablet by mouth daily.    Historical Provider, MD  polyethylene glycol (MIRALAX / GLYCOLAX) packet Take 17 g by mouth daily as needed. For constipation    Historical Provider, MD  raloxifene (EVISTA) 60 MG tablet TAKE 1 TABLET BY MOUTH EVERY DAY 08/22/15   Tiffany L Reed, DO  simvastatin (ZOCOR) 40 MG tablet TAKE 1 TABLET BY MOUTH EVERY DAY FOR CHOLESTEROL 06/26/15   Tiffany L Reed, DO  Witch Hazel (PREPARATION H EX) Apply 1 application topically daily.    Historical  Provider, MD   BP 127/86 mmHg  Pulse 93  Temp(Src) 97.4 F (36.3 C) (Axillary)  SpO2 100%   Physical Exam  Constitutional: She is oriented to person, place, and time. She appears well-developed and well-nourished.  Acting baseline according to family. Pt rocking back and forth  HENT:  Head: Normocephalic and atraumatic.  Eyes: EOM are normal. Pupils are equal, round, and reactive to light.  Neck: Normal range of motion. Neck supple. No tracheal deviation present.  Cardiovascular: Normal rate, regular rhythm, normal heart sounds and intact distal pulses.   No murmur heard. Pulmonary/Chest: Effort normal and breath sounds normal. No respiratory distress. She has no wheezes. She has no rales. She exhibits no tenderness.  Abdominal: Soft. There is no tenderness.  Musculoskeletal:       Right shoulder: She exhibits decreased range of motion and tenderness.  R Shoulder exam limited due to acuity of patient and agitation from dementia. No crepitus appreciated. No visual dislocation. Pt resists passive ROM. Pt holds arm in flexion and cradles.   Neurological: She is alert and oriented to person, place, and time.  Skin: Skin is warm and dry.  Psychiatric: She has a normal mood and affect. Her behavior is normal. Thought content normal.  Nursing note and vitals reviewed.  ED Course  Procedures (including critical care time) Labs Review Labs Reviewed - No data to display  Imaging Review Dg Shoulder Right  09/03/2015  CLINICAL DATA:  Slip and fall injury at home. EXAM: RIGHT SHOULDER - 2+ VIEW COMPARISON:  None. FINDINGS: There is no evidence of fracture or dislocation. There is no evidence of arthropathy or other focal bone abnormality. Soft tissues are unremarkable. IMPRESSION: Negative. Electronically Signed   By: Ellery Plunk M.D.   On: 09/03/2015 22:21   Dg Humerus Right  09/03/2015  CLINICAL DATA:  Slip and fall at home landing on left shoulder. Now with left shoulder and arm pain.  EXAM: RIGHT HUMERUS - 2+ VIEW COMPARISON:  None. FINDINGS: Questionable cortical irregularity about the lateral aspect of the humeral head, seen only on a single view. The distal humerus is intact. Glenohumeral alignment is maintained. Lateral views limited due to difficulty with patient positioning. IMPRESSION: Questionable cortical irregularity of the lateral humeral head, seen only on a single view. This may reflect a nondisplaced fracture. Humerus is otherwise intact Electronically Signed   By: Rubye Oaks M.D.   On: 09/03/2015 22:22   I have personally reviewed and evaluated these images and lab results as part of my medical decision-making.   EKG Interpretation None      MDM  I have reviewed and evaluated the relevant imaging studies. I have reviewed the relevant previous healthcare records. I obtained HPI from historian. Patient discussed with supervising physician  ED Course:  Assessment: Pt is a 88yF with hx Dementia who presents with right shoulder pain  after mechnical fall. On exam, pt Nontoxic/nonseptic appearing. VSS. Afebrile. Lungs CTA. Heart RRR. Abdomen nontender soft. ROM limited based on acuity of patient mental status. XR Imaging showed questionable cortical irregularity of lateral humeral head. If so, it is nondisplaced. Given 0.5 Ativan in ED for radiological examination. Will have patient continue to place arm in sling. Plan is to DC Home with follow up to Orthopedics. At time of discharge, Patient is in no acute distress. Vital Signs are stable. Patient is able to ambulate. Patient able to tolerate PO.    Disposition/Plan:  DC Home Additional Verbal discharge instructions given and discussed with patient.  Pt Instructed to f/u with Orthopedics for evaluation and treatment of symptoms. Return precautions given Pt acknowledges and agrees with plan  Supervising Physician Lavera Guiseana Duo Liu, MD   Final diagnoses:  Right shoulder pain     Audry Piliyler Makaiah Terwilliger, PA-C 09/03/15  2247  Lavera Guiseana Duo Liu, MD 09/03/15 [phone]

## 2015-09-03 NOTE — ED Notes (Signed)
Pt BIB [facility] from home. Fell 6 hrs prior to EMS call c/o right shoulder pain. Family states that pt has no complaints initially after the fall. Hx of stroke approx 15 years ago. Per family, right shoulder has been "dislocated" for years.  EMS BP 118/62, HR 80, O2 95%, RR 18

## 2015-09-03 NOTE — Discharge Instructions (Signed)
Please read and follow all provided instructions.  Your diagnoses today include:  1. Right shoulder pain    Tests performed today include:  Vital signs. See below for your results today.   Medications prescribed:   None   Home care instructions:  Follow any educational materials contained in this packet.  Follow-up instructions: Please follow-up with Orthopedics for further evaluation of symptoms and treatment   Return instructions:   Please return to the Emergency Department if you do not get better, if you get worse, or new symptoms OR  - Fever (temperature greater than 101.70F)  - Bleeding that does not stop with holding pressure to the area    -Severe pain (please note that you may be more sore the day after your accident)  - Chest Pain  - Difficulty breathing  - Severe nausea or vomiting  - Inability to tolerate food and liquids  - Passing out  - Skin becoming red around your wounds  - Change in mental status (confusion or lethargy)  - New numbness or weakness     Please return if you have any other emergent concerns.  Additional Information:  Your vital signs today were: BP 127/86 mmHg   Pulse 93   Temp(Src) 97.4 F (36.3 C) (Axillary)   SpO2 100% If your blood pressure (BP) was elevated above 135/85 this visit, please have this repeated by your doctor within one month. ---------------

## 2015-09-03 NOTE — ED Notes (Signed)
Social work at bedside.  

## 2015-09-03 NOTE — ED Notes (Signed)
PT transported to XR

## 2015-09-03 NOTE — ED Notes (Signed)
PA at bedside.

## 2015-09-03 NOTE — Progress Notes (Signed)
[facility] to provided family with [place]  EDCM spoke to patient's family at bedside.  EDCM provided patient's daughter with list of  [place], explained services.  Patient's daughter reports she was looking for someone to come out and stay with the patient for a couple of hours.  EDCM informed patient's daughter that this is private duty nursing services and would be an out of pocket expense.  EDCM provided patient's daughter with list of private duty nursing agencies.  EDCM informed patient's daughter if they decided on having home health services started in the future, they may do so through patient's pcp.  Patient's daughter thankful for resources.  No further EDCM needs at this time.

## 2015-09-11 DIAGNOSIS — M25511 Pain in right shoulder: Secondary | ICD-10-CM | POA: Diagnosis not present

## 2015-10-05 ENCOUNTER — Encounter: Payer: Self-pay | Admitting: Internal Medicine

## 2015-10-05 ENCOUNTER — Ambulatory Visit (INDEPENDENT_AMBULATORY_CARE_PROVIDER_SITE_OTHER): Payer: Medicare Other | Admitting: Internal Medicine

## 2015-10-05 VITALS — BP 128/80 | HR 64

## 2015-10-05 DIAGNOSIS — G8190 Hemiplegia, unspecified affecting unspecified side: Secondary | ICD-10-CM

## 2015-10-05 DIAGNOSIS — F0150 Vascular dementia without behavioral disturbance: Secondary | ICD-10-CM | POA: Diagnosis not present

## 2015-10-05 DIAGNOSIS — M810 Age-related osteoporosis without current pathological fracture: Secondary | ICD-10-CM | POA: Diagnosis not present

## 2015-10-05 DIAGNOSIS — K5909 Other constipation: Secondary | ICD-10-CM

## 2015-10-05 DIAGNOSIS — R627 Adult failure to thrive: Secondary | ICD-10-CM | POA: Diagnosis not present

## 2015-10-05 DIAGNOSIS — R54 Age-related physical debility: Secondary | ICD-10-CM | POA: Diagnosis not present

## 2015-10-05 DIAGNOSIS — K5900 Constipation, unspecified: Secondary | ICD-10-CM

## 2015-10-05 NOTE — Patient Instructions (Addendum)
Increase remeron to 30mg  (two tablets) at bedtime to help with sleep.  We'll call you when we get a prolia in stock.

## 2015-10-05 NOTE — Progress Notes (Signed)
Patient ID: [name], female   DOB: 03-04-1927, 82 y.o.   MRN: [id number]   [facility] Provider:  Kinsie Belford L. Renato Gailseed, D.O., C.M.D.  Code Status: DNR Goals of Care:  Advanced Directives 10/05/2015  Does patient have an advance directive? Yes  Type of Estate agentAdvance Directive Healthcare Power of [place];Out of facility DNR (pink MOST or yellow form)  Copy of advanced directive(s) in chart? Yes  Pre-existing out of facility DNR order (yellow form or pink MOST form) Yellow form placed in chart (order not valid for inpatient use)   Chief Complaint  Patient presents with  . Medical Management of Chronic Issues    5 mth follow-up, daughter [name]    HPI: Patient is a 82 y.o. female seen today for medical management of chronic diseases.    Right foot is staying swollen. She is not walking near as much.  Family elevates at night, but won't keep it up in the daytime.   Her daughter soaks it in warm epsom salts.  She c/o my hand being way too cold during exam.    Not walking as much.  Took a fall trying to move a nightstand table.  Since then, walking even less.  Has a fear of falling now.  No longer getting up spontaneously in the day.  Very little walking even at night now.  Right leg very weak.  Gets tired real fast when walking.    She had a fall on 4/3 and went to ED, had possible lateral nondisplaced humeral head fracture.  She then went to ortho.  They have used tylenol and it's gotten better over a month.  Walks with more stooped posture and sits stooped over.     Past Medical History  Diagnosis Date  . Stroke (HCC)   . Hyperlipidemia   . Confusion   . Alzheimer's dementia   . Hypertension   . Anemia   . Vascular dementia, uncomplicated   . Acute posthemorrhagic anemia   . Pain in joint, pelvic region and thigh   . Open fracture of unspecified part of neck of femur   . Pain in joint, pelvic region and thigh   . Unspecified constipation   . Urinary tract infection,  site not specified   . Hemorrhage of rectum and anus   . Abnormality of gait   . Memory loss   . Dementia in conditions classified elsewhere without behavioral disturbance   . Myalgia and myositis, unspecified   . Personal history of fall   . Other and unspecified hyperlipidemia   . Spastic hemiplegia affecting dominant side (HCC)   . Peripheral vascular disease, unspecified (HCC)   . Pain in joint, lower leg   . Osteoporosis, unspecified   . Unspecified urinary incontinence   . Unspecified essential hypertension   . Occlusion and stenosis of carotid artery without mention of cerebral infarction   . Unspecified late effects of cerebrovascular disease   . Allergic rhinitis due to pollen     Past Surgical History  Procedure Laterality Date  . Partial hip arthroplasty    . Abdominal hysterectomy      No Known Allergies    Medication List       This list is accurate as of: 10/05/15 11:23 AM.  Always use your most recent med list.               acetaminophen 325 MG tablet  Commonly known as:  TYLENOL  Take 325 mg by mouth every  6 (six) hours as needed. For pain     Calcium-Vitamin D 600-400 MG-UNIT Tabs  Take 1 tablet by mouth daily.     clopidogrel 75 MG tablet  Commonly known as:  PLAVIX  Take 75 mg by mouth daily.     hydrocortisone 25 MG suppository  Commonly known as:  ANUSOL-HC  Place 25 mg rectally 2 (two) times daily as needed for hemorrhoids or itching.     mirtazapine 15 MG tablet  Commonly known as:  REMERON  Take 15 mg by mouth at bedtime.     multivitamin with minerals Tabs tablet  Take 1 tablet by mouth daily.     polyethylene glycol packet  Commonly known as:  MIRALAX / GLYCOLAX  Take 17 g by mouth daily as needed. For constipation     PREPARATION H EX  Apply 1 application topically daily as needed (ITCHING).     raloxifene 60 MG tablet  Commonly known as:  EVISTA  Take 60 mg by mouth daily.     simvastatin 40 MG tablet  Commonly known  as:  ZOCOR  Take 40 mg by mouth daily.        Review of Systems: as per her daughter Review of Systems  Constitutional: Positive for weight loss and malaise/fatigue. Negative for fever and chills.  HENT: Negative for congestion.   Respiratory: Negative for shortness of breath.   Cardiovascular: Negative for chest pain, palpitations and leg swelling.  Gastrointestinal: Negative for abdominal pain, constipation, blood in stool and melena.  Genitourinary: Negative for dysuria.  Musculoskeletal: Positive for myalgias, joint pain and falls.  Skin: Negative for rash.  Neurological: Positive for sensory change, focal weakness and weakness. Negative for dizziness, loss of consciousness and headaches.       Right hemiparesis  Psychiatric/Behavioral: Positive for hallucinations and memory loss. The patient has insomnia.     Health Maintenance  Topic Date Due  . INFLUENZA VACCINE  01/01/2016  . TETANUS/TDAP  07/28/2023  . DEXA SCAN  Completed  . ZOSTAVAX  Completed  . PNA vac Low Risk Adult  Completed    Physical Exam: Filed Vitals:   10/05/15 1110  BP: 128/80  Pulse: 64  SpO2: 95%   There is no weight on file to calculate BMI. Physical Exam  Constitutional: No distress.  Cardiovascular: Normal rate, regular rhythm, normal heart sounds and intact distal pulses.   Pt talked through heart and lung exam; no right ankle or foot edema today  Pulmonary/Chest: Effort normal and breath sounds normal.  Abdominal: Soft. Bowel sounds are normal.  Musculoskeletal:  Head is drooping, has right arm 0/5 strength, right leg 4/5, seated in wheelchair  Neurological: She is alert.  Dysarthric--rarely able to understand speech  Skin: Skin is warm and dry.    Labs reviewed: Basic Metabolic Panel:  Recent Labs  16/10/96 1453  NA 145*  K 4.1  CL 104  CO2 24  GLUCOSE 91  BUN 18  CREATININE 0.69  CALCIUM 9.6   Liver Function Tests:  Recent Labs  01/01/15 1453  AST 22  ALT 18    ALKPHOS 47  BILITOT 0.3  PROT 6.4  ALBUMIN 4.1   No results for input(s): LIPASE, AMYLASE in the last 8760 hours. No results for input(s): AMMONIA in the last 8760 hours. CBC:  Recent Labs  01/01/15 1453  WBC 4.9  NEUTROABS 2.2  HCT 35.7  MCV 96  PLT 247   Lipid Panel:  Recent Labs  01/01/15 1453  CHOL 150  HDL 60  LDLCALC 78  TRIG 59  CHOLHDL 2.5   Assessment/Plan 1. Vascular dementia, without behavioral disturbance -continue 24 hr supervision by family -off meds for this due to advanced stage  2. Senile osteoporosis -was severe despite her evista -previously convinced family to try prolia due to frequent ambulation -since then, she's been walking much less, but her daughter still wants her to have it and agrees to pay the $50   3. Chronic constipation -cont miralax daily as needed  4. Hemiparesis (HCC) -less ambulatory fortunately b/c she's been a mjr fall risk -just had a fall with right shoulder injury and not as mobile now  5. Frailty -quite notable -continues to eat, but still losing weight -there has been a longstanding question about colon ca with multiple rectal bleeding spells in the past, but family wanted to keep plavix due to risk of further strokes  6. Failure to thrive in adult -will try increase in remeron to see if she'll sleep better with higher dose  Labs/tests ordered:  No orders of the defined types were placed in this encounter.   Next appt:  10/19/2015  Jorey Dollard L. Jamal Pavon, D.O. Geriatrics [facility] [address], [place] [address] Cell Phone (Mon-Fri 8am-5pm):  [phone] On Call:  [phone] & follow prompts after 5pm & weekends Office Phone:  [phone] Office Fax:  [phone]

## 2015-10-14 ENCOUNTER — Other Ambulatory Visit: Payer: Self-pay | Admitting: Internal Medicine

## 2015-10-19 ENCOUNTER — Ambulatory Visit (INDEPENDENT_AMBULATORY_CARE_PROVIDER_SITE_OTHER): Payer: Medicare Other

## 2015-10-19 DIAGNOSIS — M810 Age-related osteoporosis without current pathological fracture: Secondary | ICD-10-CM | POA: Diagnosis not present

## 2015-10-19 MED ORDER — DENOSUMAB 60 MG/ML ~~LOC~~ SOLN
60.0000 mg | Freq: Once | SUBCUTANEOUS | Status: AC
  Administered 2015-10-19: 60 mg via SUBCUTANEOUS

## 2015-10-21 ENCOUNTER — Other Ambulatory Visit: Payer: Self-pay | Admitting: Internal Medicine

## 2015-11-25 ENCOUNTER — Other Ambulatory Visit: Payer: Self-pay | Admitting: Internal Medicine

## 2015-12-03 ENCOUNTER — Ambulatory Visit (INDEPENDENT_AMBULATORY_CARE_PROVIDER_SITE_OTHER): Payer: Medicare Other | Admitting: Internal Medicine

## 2015-12-03 ENCOUNTER — Encounter: Payer: Self-pay | Admitting: Internal Medicine

## 2015-12-03 VITALS — BP 120/80 | HR 77

## 2015-12-03 DIAGNOSIS — R54 Age-related physical debility: Secondary | ICD-10-CM

## 2015-12-03 DIAGNOSIS — I69320 Aphasia following cerebral infarction: Secondary | ICD-10-CM

## 2015-12-03 DIAGNOSIS — I69920 Aphasia following unspecified cerebrovascular disease: Secondary | ICD-10-CM | POA: Diagnosis not present

## 2015-12-03 DIAGNOSIS — G8190 Hemiplegia, unspecified affecting unspecified side: Secondary | ICD-10-CM

## 2015-12-03 DIAGNOSIS — F0151 Vascular dementia with behavioral disturbance: Secondary | ICD-10-CM | POA: Diagnosis not present

## 2015-12-03 DIAGNOSIS — F01518 Vascular dementia, unspecified severity, with other behavioral disturbance: Secondary | ICD-10-CM

## 2015-12-03 DIAGNOSIS — G4700 Insomnia, unspecified: Secondary | ICD-10-CM | POA: Diagnosis not present

## 2015-12-03 DIAGNOSIS — R627 Adult failure to thrive: Secondary | ICD-10-CM

## 2015-12-03 DIAGNOSIS — K5901 Slow transit constipation: Secondary | ICD-10-CM

## 2015-12-03 MED ORDER — ACETAMINOPHEN 500 MG PO TABS: 500.0000 mg | ORAL_TABLET | Freq: Three times a day (TID) | ORAL | Status: AC

## 2015-12-03 MED ORDER — MIRTAZAPINE 30 MG PO TABS: 30.0000 mg | ORAL_TABLET | Freq: Every day | ORAL | Status: DC

## 2015-12-03 NOTE — Progress Notes (Signed)
[facility] Provider: Damarri Rampy L. Renato Gailseed, D.O., C.M.D.  Code Status: DNR Goals of Care:  Advanced Directives 12/03/2015  Does patient have an advance directive? Yes  Type of Estate agentAdvance Directive Healthcare Power of [place] Columbia[place];Out of facility DNR (pink MOST or yellow form)  Does patient want to make changes to advanced directive? No - Patient declined  Copy of advanced directive(s) in chart? Yes  Pre-existing out of facility DNR order (yellow form or pink MOST form) Yellow form placed in chart (order not valid for inpatient use)     Chief Complaint  Patient presents with  . Acute Visit    right knee pain    HPI: Patient is a 82 y.o. female seen today for an acute visit for right knee pain.  No falls since last visit.    Due to her dementia, she could not be weighed and refused to get her temp checked.  She is holding her baby.  It was also challenging to check her bp b/c she said "get that thing off me" when cuff was placed on arm.  Sleeps only 3-4 hrs in bed.  Then gets up.  No longer walking since Thursday.  Had been walking a little until then (not steady at all and very weak).  She straightens legs straight out when she gets up and can't stand. Still using bathroom ok for BMs, but is incontinent sometimes--always has to be changed for urine.  There are thoughts that her right knee hurts.  Right knee is developing a contracture. She is bony and appears to have lost weight but unable to stand to be weighed (used to weight 102, suspect weighs 90 or less now).  Still eating but is on remeron.  Now she is going from constipated to leakage with her bms.  Last month had 17 bms total as recorded by her one daughter.  She had GI bleeding in the past and the thought was that she may have colon cancer, but due to her frailty post stroke and dementia, family had opted not to evaluate further.  She was temporarily off plavix which since was restarted due to no recent evidence of  bleeding  Britta MccreedyBarbara asks about a hospital bed.  Also ask about a lift, as well.  Britta MccreedyBarbara has had back surgery and cannot transfer her and her other daughter also cannot continue to transfer her.  She has contracture of her RUE and developing in RLE.  Wears brace on right for drop foot.  She came today in a wheelchair.  I could hardly straighten her right leg.  She admitted to pain in it.    She will talk loudly at night to the [place], sometimes yelling.  Does not sleep well and they ask if remeron can be increased (can try).    They want to keep her at home as long as possible if not forever.    Past Medical History  Diagnosis Date  . Stroke (HCC)   . Hyperlipidemia   . Confusion   . Alzheimer's dementia   . Hypertension   . Anemia   . Vascular dementia, uncomplicated   . Acute posthemorrhagic anemia   . Pain in joint, pelvic region and thigh   . Open fracture of unspecified part of neck of femur   . Pain in joint, pelvic region and thigh   . Unspecified constipation   . Urinary tract infection, site not specified   . Hemorrhage of rectum and anus   . Abnormality  of gait   . Memory loss   . Dementia in conditions classified elsewhere without behavioral disturbance   . Myalgia and myositis, unspecified   . Personal history of fall   . Other and unspecified hyperlipidemia   . Spastic hemiplegia affecting dominant side (HCC)   . Peripheral vascular disease, unspecified (HCC)   . Pain in joint, lower leg   . Osteoporosis, unspecified   . Unspecified urinary incontinence   . Unspecified essential hypertension   . Occlusion and stenosis of carotid artery without mention of cerebral infarction   . Unspecified late effects of cerebrovascular disease   . Allergic rhinitis due to pollen     Past Surgical History  Procedure Laterality Date  . Partial hip arthroplasty    . Abdominal hysterectomy      No Known Allergies    Medication List       This list is accurate as of: 12/03/15  10:53 AM.  Always use your most recent med list.               acetaminophen 325 MG tablet  Commonly known as:  TYLENOL  Take 325 mg by mouth every 6 (six) hours as needed. For pain     Calcium-Vitamin D 600-400 MG-UNIT Tabs  Take 1 tablet by mouth daily.     clopidogrel 75 MG tablet  Commonly known as:  PLAVIX  TAKE 1 TABLET BY MOUTH EVERY DAY     mirtazapine 15 MG tablet  Commonly known as:  REMERON  TAKE 1 TABLET BY MOUTH AT BEDTIME     multivitamin with minerals Tabs tablet  Take 1 tablet by mouth daily.     polyethylene glycol packet  Commonly known as:  MIRALAX / GLYCOLAX  Take 17 g by mouth daily as needed. For constipation     raloxifene 60 MG tablet  Commonly known as:  EVISTA  TK 1 T PO QD     simvastatin 40 MG tablet  Commonly known as:  ZOCOR  Take 40 mg by mouth daily.        Review of Systems:  Review of Systems  Constitutional: Positive for weight loss and malaise/fatigue. Negative for fever and chills.  HENT: Negative for congestion and hearing loss.   Eyes: Negative for blurred vision.  Respiratory: Negative for shortness of breath.   Cardiovascular: Negative for chest pain and leg swelling.  Gastrointestinal: Positive for diarrhea and constipation. Negative for abdominal pain, blood in stool and melena.       Some fecal incontinence, h/o GI bleed and melena  Genitourinary: Negative for dysuria.       Chronic urinary incontinence  Musculoskeletal: Positive for joint pain. Negative for falls.       No longer ambulatory  Skin: Negative for rash.  Neurological: Positive for sensory change, focal weakness and weakness. Negative for dizziness, loss of consciousness and headaches.  Psychiatric/Behavioral: Positive for depression and memory loss. The patient has insomnia.     Health Maintenance  Topic Date Due  . INFLUENZA VACCINE  01/01/2016  . TETANUS/TDAP  07/28/2023  . DEXA SCAN  Completed  . ZOSTAVAX  Completed  . PNA vac Low Risk Adult   Completed    Physical Exam: Filed Vitals:   12/03/15 1027  BP: 120/80  Pulse: 77  SpO2: 92%   There is no weight on file to calculate BMI. Physical Exam  Constitutional: No distress.  Frail black female, cachectic appearing in wheelchair  HENT:  Head: Normocephalic  and atraumatic.  Cardiovascular: Normal rate, regular rhythm, normal heart sounds and intact distal pulses.   Pulmonary/Chest: Effort normal and breath sounds normal.  Abdominal: Bowel sounds are normal.  Musculoskeletal:  See neuro; admits to pain during ROM of right leg  Neurological: She is alert.  Dysarthric, aphasic, unable to understand speech; right hemiparesis with foot drop in brace; contracture developing of right knee; one of right elbow; here in wheelchair; neck flexed and sidebent right  Skin: Skin is warm and dry.  Psychiatric:  Still laughs, but also will push staff away and resist vital signs; did permit exam by me today (not always though)    Labs reviewed: Basic Metabolic Panel:  Recent Labs  96/04/54 1453  NA 145*  K 4.1  CL 104  CO2 24  GLUCOSE 91  BUN 18  CREATININE 0.69  CALCIUM 9.6   Liver Function Tests:  Recent Labs  01/01/15 1453  AST 22  ALT 18  ALKPHOS 47  BILITOT 0.3  PROT 6.4  ALBUMIN 4.1   No results for input(s): LIPASE, AMYLASE in the last 8760 hours. No results for input(s): AMMONIA in the last 8760 hours. CBC:  Recent Labs  01/01/15 1453  WBC 4.9  NEUTROABS 2.2  HCT 35.7  MCV 96  PLT 247   Lipid Panel:  Recent Labs  01/01/15 1453  CHOL 150  HDL 60  LDLCALC 78  TRIG 59  CHOLHDL 2.5   Assessment/Plan 1. Vascular dementia with behavioral disturbance - end stage, appears she should be hospice eligible to me - continue conservative mgt and avoiding SNF placement  - if not eligible, will need to arrange for more help at home for her family (just getting bathed 2x per week by CNA) and they do not have the needed equipment - mirtazapine (REMERON)  30 MG tablet; Take 1 tablet (30 mg total) by mouth at bedtime.  Dispense: 90 tablet; Refill: 3 - Ambulatory referral to Hospice  2. Hemiparesis (HCC) - due to prior stroke, now developing contractures of joints and advancing, now mostly bed, recliner and wheelchair bound as unable to ambulate -advised to schedule tylenol  po tid for now and see if that helps with knee pain - Ambulatory referral to Hospice  3. Frailty - see #4 - Ambulatory referral to Hospice  4. Failure to thrive in adult -appears to be continuing to lose weight despite intake and family support and remeron -due to end stage vascular dementia - Ambulatory referral to Hospice  5. Slow transit constipation -cont miralax, hydration - Ambulatory referral to Hospice  6. Aphasia as late effect of cerebrovascular accident - has progressively worsened with dementia progression - Ambulatory referral to Hospice  7. Insomnia -has been ongoing, with yelling at night worsening, will increase remeron to  (unsure it will help since usually appetite and sleep do better at lower doses)   Labs/tests ordered:  @ Next appt:  02/07/2016  Danaly Bari L. Westlee Devita, D.O. Geriatrics [facility] [address], [place] [address] Cell Phone (Mon-Fri 8am-5pm):  [phone] On Call:  [phone] & follow prompts after 5pm & weekends Office Phone:  [phone] Office Fax:  [phone]

## 2015-12-08 DIAGNOSIS — R627 Adult failure to thrive: Secondary | ICD-10-CM | POA: Diagnosis not present

## 2015-12-08 DIAGNOSIS — G309 Alzheimer's disease, unspecified: Secondary | ICD-10-CM | POA: Diagnosis not present

## 2015-12-08 DIAGNOSIS — F0281 Dementia in other diseases classified elsewhere with behavioral disturbance: Secondary | ICD-10-CM | POA: Diagnosis not present

## 2015-12-08 DIAGNOSIS — I6990 Unspecified sequelae of unspecified cerebrovascular disease: Secondary | ICD-10-CM | POA: Diagnosis not present

## 2015-12-08 DIAGNOSIS — F0151 Vascular dementia with behavioral disturbance: Secondary | ICD-10-CM | POA: Diagnosis not present

## 2015-12-08 DIAGNOSIS — K5901 Slow transit constipation: Secondary | ICD-10-CM | POA: Diagnosis not present

## 2015-12-08 DIAGNOSIS — I69920 Aphasia following unspecified cerebrovascular disease: Secondary | ICD-10-CM | POA: Diagnosis not present

## 2015-12-08 DIAGNOSIS — I69051 Hemiplegia and hemiparesis following nontraumatic subarachnoid hemorrhage affecting right dominant side: Secondary | ICD-10-CM | POA: Diagnosis not present

## 2015-12-11 DIAGNOSIS — I69051 Hemiplegia and hemiparesis following nontraumatic subarachnoid hemorrhage affecting right dominant side: Secondary | ICD-10-CM | POA: Diagnosis not present

## 2015-12-11 DIAGNOSIS — I69920 Aphasia following unspecified cerebrovascular disease: Secondary | ICD-10-CM | POA: Diagnosis not present

## 2015-12-11 DIAGNOSIS — R627 Adult failure to thrive: Secondary | ICD-10-CM | POA: Diagnosis not present

## 2015-12-11 DIAGNOSIS — G309 Alzheimer's disease, unspecified: Secondary | ICD-10-CM | POA: Diagnosis not present

## 2015-12-11 DIAGNOSIS — K5901 Slow transit constipation: Secondary | ICD-10-CM | POA: Diagnosis not present

## 2015-12-11 DIAGNOSIS — I6990 Unspecified sequelae of unspecified cerebrovascular disease: Secondary | ICD-10-CM | POA: Diagnosis not present

## 2015-12-12 DIAGNOSIS — I6990 Unspecified sequelae of unspecified cerebrovascular disease: Secondary | ICD-10-CM | POA: Diagnosis not present

## 2015-12-12 DIAGNOSIS — G309 Alzheimer's disease, unspecified: Secondary | ICD-10-CM | POA: Diagnosis not present

## 2015-12-12 DIAGNOSIS — I69920 Aphasia following unspecified cerebrovascular disease: Secondary | ICD-10-CM | POA: Diagnosis not present

## 2015-12-12 DIAGNOSIS — R627 Adult failure to thrive: Secondary | ICD-10-CM | POA: Diagnosis not present

## 2015-12-12 DIAGNOSIS — K5901 Slow transit constipation: Secondary | ICD-10-CM | POA: Diagnosis not present

## 2015-12-12 DIAGNOSIS — I69051 Hemiplegia and hemiparesis following nontraumatic subarachnoid hemorrhage affecting right dominant side: Secondary | ICD-10-CM | POA: Diagnosis not present

## 2015-12-13 DIAGNOSIS — R627 Adult failure to thrive: Secondary | ICD-10-CM | POA: Diagnosis not present

## 2015-12-13 DIAGNOSIS — I69920 Aphasia following unspecified cerebrovascular disease: Secondary | ICD-10-CM | POA: Diagnosis not present

## 2015-12-13 DIAGNOSIS — I6990 Unspecified sequelae of unspecified cerebrovascular disease: Secondary | ICD-10-CM | POA: Diagnosis not present

## 2015-12-13 DIAGNOSIS — K5901 Slow transit constipation: Secondary | ICD-10-CM | POA: Diagnosis not present

## 2015-12-13 DIAGNOSIS — I69051 Hemiplegia and hemiparesis following nontraumatic subarachnoid hemorrhage affecting right dominant side: Secondary | ICD-10-CM | POA: Diagnosis not present

## 2015-12-13 DIAGNOSIS — G309 Alzheimer's disease, unspecified: Secondary | ICD-10-CM | POA: Diagnosis not present

## 2015-12-14 DIAGNOSIS — I69051 Hemiplegia and hemiparesis following nontraumatic subarachnoid hemorrhage affecting right dominant side: Secondary | ICD-10-CM | POA: Diagnosis not present

## 2015-12-14 DIAGNOSIS — G309 Alzheimer's disease, unspecified: Secondary | ICD-10-CM | POA: Diagnosis not present

## 2015-12-14 DIAGNOSIS — I6990 Unspecified sequelae of unspecified cerebrovascular disease: Secondary | ICD-10-CM | POA: Diagnosis not present

## 2015-12-14 DIAGNOSIS — K5901 Slow transit constipation: Secondary | ICD-10-CM | POA: Diagnosis not present

## 2015-12-14 DIAGNOSIS — I69920 Aphasia following unspecified cerebrovascular disease: Secondary | ICD-10-CM | POA: Diagnosis not present

## 2015-12-14 DIAGNOSIS — R627 Adult failure to thrive: Secondary | ICD-10-CM | POA: Diagnosis not present

## 2015-12-18 DIAGNOSIS — G309 Alzheimer's disease, unspecified: Secondary | ICD-10-CM | POA: Diagnosis not present

## 2015-12-18 DIAGNOSIS — I69920 Aphasia following unspecified cerebrovascular disease: Secondary | ICD-10-CM | POA: Diagnosis not present

## 2015-12-18 DIAGNOSIS — R627 Adult failure to thrive: Secondary | ICD-10-CM | POA: Diagnosis not present

## 2015-12-18 DIAGNOSIS — K5901 Slow transit constipation: Secondary | ICD-10-CM | POA: Diagnosis not present

## 2015-12-18 DIAGNOSIS — I69051 Hemiplegia and hemiparesis following nontraumatic subarachnoid hemorrhage affecting right dominant side: Secondary | ICD-10-CM | POA: Diagnosis not present

## 2015-12-18 DIAGNOSIS — I6990 Unspecified sequelae of unspecified cerebrovascular disease: Secondary | ICD-10-CM | POA: Diagnosis not present

## 2015-12-20 ENCOUNTER — Other Ambulatory Visit: Payer: Self-pay | Admitting: Internal Medicine

## 2015-12-21 DIAGNOSIS — R627 Adult failure to thrive: Secondary | ICD-10-CM | POA: Diagnosis not present

## 2015-12-21 DIAGNOSIS — I69920 Aphasia following unspecified cerebrovascular disease: Secondary | ICD-10-CM | POA: Diagnosis not present

## 2015-12-21 DIAGNOSIS — K5901 Slow transit constipation: Secondary | ICD-10-CM | POA: Diagnosis not present

## 2015-12-21 DIAGNOSIS — G309 Alzheimer's disease, unspecified: Secondary | ICD-10-CM | POA: Diagnosis not present

## 2015-12-21 DIAGNOSIS — I6990 Unspecified sequelae of unspecified cerebrovascular disease: Secondary | ICD-10-CM | POA: Diagnosis not present

## 2015-12-21 DIAGNOSIS — I69051 Hemiplegia and hemiparesis following nontraumatic subarachnoid hemorrhage affecting right dominant side: Secondary | ICD-10-CM | POA: Diagnosis not present

## 2015-12-25 DIAGNOSIS — I6990 Unspecified sequelae of unspecified cerebrovascular disease: Secondary | ICD-10-CM | POA: Diagnosis not present

## 2015-12-25 DIAGNOSIS — K5901 Slow transit constipation: Secondary | ICD-10-CM | POA: Diagnosis not present

## 2015-12-25 DIAGNOSIS — G309 Alzheimer's disease, unspecified: Secondary | ICD-10-CM | POA: Diagnosis not present

## 2015-12-25 DIAGNOSIS — R627 Adult failure to thrive: Secondary | ICD-10-CM | POA: Diagnosis not present

## 2015-12-25 DIAGNOSIS — I69920 Aphasia following unspecified cerebrovascular disease: Secondary | ICD-10-CM | POA: Diagnosis not present

## 2015-12-25 DIAGNOSIS — I69051 Hemiplegia and hemiparesis following nontraumatic subarachnoid hemorrhage affecting right dominant side: Secondary | ICD-10-CM | POA: Diagnosis not present

## 2015-12-28 DIAGNOSIS — I6990 Unspecified sequelae of unspecified cerebrovascular disease: Secondary | ICD-10-CM | POA: Diagnosis not present

## 2015-12-28 DIAGNOSIS — R627 Adult failure to thrive: Secondary | ICD-10-CM | POA: Diagnosis not present

## 2015-12-28 DIAGNOSIS — K5901 Slow transit constipation: Secondary | ICD-10-CM | POA: Diagnosis not present

## 2015-12-28 DIAGNOSIS — I69051 Hemiplegia and hemiparesis following nontraumatic subarachnoid hemorrhage affecting right dominant side: Secondary | ICD-10-CM | POA: Diagnosis not present

## 2015-12-28 DIAGNOSIS — I69920 Aphasia following unspecified cerebrovascular disease: Secondary | ICD-10-CM | POA: Diagnosis not present

## 2015-12-28 DIAGNOSIS — G309 Alzheimer's disease, unspecified: Secondary | ICD-10-CM | POA: Diagnosis not present

## 2016-01-01 DIAGNOSIS — F0151 Vascular dementia with behavioral disturbance: Secondary | ICD-10-CM | POA: Diagnosis not present

## 2016-01-01 DIAGNOSIS — I69051 Hemiplegia and hemiparesis following nontraumatic subarachnoid hemorrhage affecting right dominant side: Secondary | ICD-10-CM | POA: Diagnosis not present

## 2016-01-01 DIAGNOSIS — I69920 Aphasia following unspecified cerebrovascular disease: Secondary | ICD-10-CM | POA: Diagnosis not present

## 2016-01-01 DIAGNOSIS — K5901 Slow transit constipation: Secondary | ICD-10-CM | POA: Diagnosis not present

## 2016-01-01 DIAGNOSIS — R627 Adult failure to thrive: Secondary | ICD-10-CM | POA: Diagnosis not present

## 2016-01-01 DIAGNOSIS — G309 Alzheimer's disease, unspecified: Secondary | ICD-10-CM | POA: Diagnosis not present

## 2016-01-01 DIAGNOSIS — I6990 Unspecified sequelae of unspecified cerebrovascular disease: Secondary | ICD-10-CM | POA: Diagnosis not present

## 2016-01-01 DIAGNOSIS — F0281 Dementia in other diseases classified elsewhere with behavioral disturbance: Secondary | ICD-10-CM | POA: Diagnosis not present

## 2016-01-04 DIAGNOSIS — G309 Alzheimer's disease, unspecified: Secondary | ICD-10-CM | POA: Diagnosis not present

## 2016-01-04 DIAGNOSIS — I69051 Hemiplegia and hemiparesis following nontraumatic subarachnoid hemorrhage affecting right dominant side: Secondary | ICD-10-CM | POA: Diagnosis not present

## 2016-01-04 DIAGNOSIS — R627 Adult failure to thrive: Secondary | ICD-10-CM | POA: Diagnosis not present

## 2016-01-04 DIAGNOSIS — I69920 Aphasia following unspecified cerebrovascular disease: Secondary | ICD-10-CM | POA: Diagnosis not present

## 2016-01-04 DIAGNOSIS — K5901 Slow transit constipation: Secondary | ICD-10-CM | POA: Diagnosis not present

## 2016-01-04 DIAGNOSIS — I6990 Unspecified sequelae of unspecified cerebrovascular disease: Secondary | ICD-10-CM | POA: Diagnosis not present

## 2016-01-08 ENCOUNTER — Other Ambulatory Visit: Payer: Self-pay | Admitting: *Deleted

## 2016-01-08 DIAGNOSIS — G309 Alzheimer's disease, unspecified: Secondary | ICD-10-CM | POA: Diagnosis not present

## 2016-01-08 DIAGNOSIS — F0151 Vascular dementia with behavioral disturbance: Secondary | ICD-10-CM

## 2016-01-08 DIAGNOSIS — I69920 Aphasia following unspecified cerebrovascular disease: Secondary | ICD-10-CM | POA: Diagnosis not present

## 2016-01-08 DIAGNOSIS — R627 Adult failure to thrive: Secondary | ICD-10-CM | POA: Diagnosis not present

## 2016-01-08 DIAGNOSIS — I69051 Hemiplegia and hemiparesis following nontraumatic subarachnoid hemorrhage affecting right dominant side: Secondary | ICD-10-CM | POA: Diagnosis not present

## 2016-01-08 DIAGNOSIS — F01518 Vascular dementia, unspecified severity, with other behavioral disturbance: Secondary | ICD-10-CM

## 2016-01-08 DIAGNOSIS — K5901 Slow transit constipation: Secondary | ICD-10-CM | POA: Diagnosis not present

## 2016-01-08 DIAGNOSIS — I6990 Unspecified sequelae of unspecified cerebrovascular disease: Secondary | ICD-10-CM | POA: Diagnosis not present

## 2016-01-08 MED ORDER — MIRTAZAPINE 30 MG PO TABS: 30.0000 mg | ORAL_TABLET | Freq: Every day | ORAL | 1 refills | Status: DC

## 2016-01-08 NOTE — Telephone Encounter (Signed)
Patient caregiver requested refill to be sent to pharmacy.

## 2016-01-11 DIAGNOSIS — R627 Adult failure to thrive: Secondary | ICD-10-CM | POA: Diagnosis not present

## 2016-01-11 DIAGNOSIS — I69920 Aphasia following unspecified cerebrovascular disease: Secondary | ICD-10-CM | POA: Diagnosis not present

## 2016-01-11 DIAGNOSIS — G309 Alzheimer's disease, unspecified: Secondary | ICD-10-CM | POA: Diagnosis not present

## 2016-01-11 DIAGNOSIS — K5901 Slow transit constipation: Secondary | ICD-10-CM | POA: Diagnosis not present

## 2016-01-11 DIAGNOSIS — I6990 Unspecified sequelae of unspecified cerebrovascular disease: Secondary | ICD-10-CM | POA: Diagnosis not present

## 2016-01-11 DIAGNOSIS — I69051 Hemiplegia and hemiparesis following nontraumatic subarachnoid hemorrhage affecting right dominant side: Secondary | ICD-10-CM | POA: Diagnosis not present

## 2016-01-15 DIAGNOSIS — R627 Adult failure to thrive: Secondary | ICD-10-CM | POA: Diagnosis not present

## 2016-01-15 DIAGNOSIS — K5901 Slow transit constipation: Secondary | ICD-10-CM | POA: Diagnosis not present

## 2016-01-15 DIAGNOSIS — I69051 Hemiplegia and hemiparesis following nontraumatic subarachnoid hemorrhage affecting right dominant side: Secondary | ICD-10-CM | POA: Diagnosis not present

## 2016-01-15 DIAGNOSIS — G309 Alzheimer's disease, unspecified: Secondary | ICD-10-CM | POA: Diagnosis not present

## 2016-01-15 DIAGNOSIS — I6990 Unspecified sequelae of unspecified cerebrovascular disease: Secondary | ICD-10-CM | POA: Diagnosis not present

## 2016-01-15 DIAGNOSIS — I69920 Aphasia following unspecified cerebrovascular disease: Secondary | ICD-10-CM | POA: Diagnosis not present

## 2016-01-16 DIAGNOSIS — K5901 Slow transit constipation: Secondary | ICD-10-CM | POA: Diagnosis not present

## 2016-01-16 DIAGNOSIS — I6990 Unspecified sequelae of unspecified cerebrovascular disease: Secondary | ICD-10-CM | POA: Diagnosis not present

## 2016-01-16 DIAGNOSIS — I69051 Hemiplegia and hemiparesis following nontraumatic subarachnoid hemorrhage affecting right dominant side: Secondary | ICD-10-CM | POA: Diagnosis not present

## 2016-01-16 DIAGNOSIS — G309 Alzheimer's disease, unspecified: Secondary | ICD-10-CM | POA: Diagnosis not present

## 2016-01-16 DIAGNOSIS — I69920 Aphasia following unspecified cerebrovascular disease: Secondary | ICD-10-CM | POA: Diagnosis not present

## 2016-01-16 DIAGNOSIS — R627 Adult failure to thrive: Secondary | ICD-10-CM | POA: Diagnosis not present

## 2016-01-18 DIAGNOSIS — K5901 Slow transit constipation: Secondary | ICD-10-CM | POA: Diagnosis not present

## 2016-01-18 DIAGNOSIS — I69051 Hemiplegia and hemiparesis following nontraumatic subarachnoid hemorrhage affecting right dominant side: Secondary | ICD-10-CM | POA: Diagnosis not present

## 2016-01-18 DIAGNOSIS — I6990 Unspecified sequelae of unspecified cerebrovascular disease: Secondary | ICD-10-CM | POA: Diagnosis not present

## 2016-01-18 DIAGNOSIS — I69920 Aphasia following unspecified cerebrovascular disease: Secondary | ICD-10-CM | POA: Diagnosis not present

## 2016-01-18 DIAGNOSIS — R627 Adult failure to thrive: Secondary | ICD-10-CM | POA: Diagnosis not present

## 2016-01-18 DIAGNOSIS — G309 Alzheimer's disease, unspecified: Secondary | ICD-10-CM | POA: Diagnosis not present

## 2016-01-19 ENCOUNTER — Other Ambulatory Visit: Payer: Self-pay | Admitting: Internal Medicine

## 2016-01-22 DIAGNOSIS — G309 Alzheimer's disease, unspecified: Secondary | ICD-10-CM | POA: Diagnosis not present

## 2016-01-22 DIAGNOSIS — R627 Adult failure to thrive: Secondary | ICD-10-CM | POA: Diagnosis not present

## 2016-01-22 DIAGNOSIS — I6990 Unspecified sequelae of unspecified cerebrovascular disease: Secondary | ICD-10-CM | POA: Diagnosis not present

## 2016-01-22 DIAGNOSIS — K5901 Slow transit constipation: Secondary | ICD-10-CM | POA: Diagnosis not present

## 2016-01-22 DIAGNOSIS — I69920 Aphasia following unspecified cerebrovascular disease: Secondary | ICD-10-CM | POA: Diagnosis not present

## 2016-01-22 DIAGNOSIS — I69051 Hemiplegia and hemiparesis following nontraumatic subarachnoid hemorrhage affecting right dominant side: Secondary | ICD-10-CM | POA: Diagnosis not present

## 2016-01-24 DIAGNOSIS — I69051 Hemiplegia and hemiparesis following nontraumatic subarachnoid hemorrhage affecting right dominant side: Secondary | ICD-10-CM | POA: Diagnosis not present

## 2016-01-24 DIAGNOSIS — K5901 Slow transit constipation: Secondary | ICD-10-CM | POA: Diagnosis not present

## 2016-01-24 DIAGNOSIS — G309 Alzheimer's disease, unspecified: Secondary | ICD-10-CM | POA: Diagnosis not present

## 2016-01-24 DIAGNOSIS — I69920 Aphasia following unspecified cerebrovascular disease: Secondary | ICD-10-CM | POA: Diagnosis not present

## 2016-01-24 DIAGNOSIS — R627 Adult failure to thrive: Secondary | ICD-10-CM | POA: Diagnosis not present

## 2016-01-24 DIAGNOSIS — I6990 Unspecified sequelae of unspecified cerebrovascular disease: Secondary | ICD-10-CM | POA: Diagnosis not present

## 2016-01-25 DIAGNOSIS — K5901 Slow transit constipation: Secondary | ICD-10-CM | POA: Diagnosis not present

## 2016-01-25 DIAGNOSIS — G309 Alzheimer's disease, unspecified: Secondary | ICD-10-CM | POA: Diagnosis not present

## 2016-01-25 DIAGNOSIS — I6990 Unspecified sequelae of unspecified cerebrovascular disease: Secondary | ICD-10-CM | POA: Diagnosis not present

## 2016-01-25 DIAGNOSIS — I69920 Aphasia following unspecified cerebrovascular disease: Secondary | ICD-10-CM | POA: Diagnosis not present

## 2016-01-25 DIAGNOSIS — R627 Adult failure to thrive: Secondary | ICD-10-CM | POA: Diagnosis not present

## 2016-01-25 DIAGNOSIS — I69051 Hemiplegia and hemiparesis following nontraumatic subarachnoid hemorrhage affecting right dominant side: Secondary | ICD-10-CM | POA: Diagnosis not present

## 2016-01-29 DIAGNOSIS — G309 Alzheimer's disease, unspecified: Secondary | ICD-10-CM | POA: Diagnosis not present

## 2016-01-29 DIAGNOSIS — K5901 Slow transit constipation: Secondary | ICD-10-CM | POA: Diagnosis not present

## 2016-01-29 DIAGNOSIS — I69051 Hemiplegia and hemiparesis following nontraumatic subarachnoid hemorrhage affecting right dominant side: Secondary | ICD-10-CM | POA: Diagnosis not present

## 2016-01-29 DIAGNOSIS — R627 Adult failure to thrive: Secondary | ICD-10-CM | POA: Diagnosis not present

## 2016-01-29 DIAGNOSIS — I69920 Aphasia following unspecified cerebrovascular disease: Secondary | ICD-10-CM | POA: Diagnosis not present

## 2016-01-29 DIAGNOSIS — I6990 Unspecified sequelae of unspecified cerebrovascular disease: Secondary | ICD-10-CM | POA: Diagnosis not present

## 2016-02-01 DIAGNOSIS — I69051 Hemiplegia and hemiparesis following nontraumatic subarachnoid hemorrhage affecting right dominant side: Secondary | ICD-10-CM | POA: Diagnosis not present

## 2016-02-01 DIAGNOSIS — R627 Adult failure to thrive: Secondary | ICD-10-CM | POA: Diagnosis not present

## 2016-02-01 DIAGNOSIS — K5901 Slow transit constipation: Secondary | ICD-10-CM | POA: Diagnosis not present

## 2016-02-01 DIAGNOSIS — I6990 Unspecified sequelae of unspecified cerebrovascular disease: Secondary | ICD-10-CM | POA: Diagnosis not present

## 2016-02-01 DIAGNOSIS — F0281 Dementia in other diseases classified elsewhere with behavioral disturbance: Secondary | ICD-10-CM | POA: Diagnosis not present

## 2016-02-01 DIAGNOSIS — F0151 Vascular dementia with behavioral disturbance: Secondary | ICD-10-CM | POA: Diagnosis not present

## 2016-02-01 DIAGNOSIS — G309 Alzheimer's disease, unspecified: Secondary | ICD-10-CM | POA: Diagnosis not present

## 2016-02-01 DIAGNOSIS — I69920 Aphasia following unspecified cerebrovascular disease: Secondary | ICD-10-CM | POA: Diagnosis not present

## 2016-02-05 DIAGNOSIS — I69920 Aphasia following unspecified cerebrovascular disease: Secondary | ICD-10-CM | POA: Diagnosis not present

## 2016-02-05 DIAGNOSIS — I6990 Unspecified sequelae of unspecified cerebrovascular disease: Secondary | ICD-10-CM | POA: Diagnosis not present

## 2016-02-05 DIAGNOSIS — G309 Alzheimer's disease, unspecified: Secondary | ICD-10-CM | POA: Diagnosis not present

## 2016-02-05 DIAGNOSIS — R627 Adult failure to thrive: Secondary | ICD-10-CM | POA: Diagnosis not present

## 2016-02-05 DIAGNOSIS — I69051 Hemiplegia and hemiparesis following nontraumatic subarachnoid hemorrhage affecting right dominant side: Secondary | ICD-10-CM | POA: Diagnosis not present

## 2016-02-05 DIAGNOSIS — K5901 Slow transit constipation: Secondary | ICD-10-CM | POA: Diagnosis not present

## 2016-02-06 ENCOUNTER — Other Ambulatory Visit: Payer: Self-pay | Admitting: Internal Medicine

## 2016-02-06 DIAGNOSIS — I69920 Aphasia following unspecified cerebrovascular disease: Secondary | ICD-10-CM | POA: Diagnosis not present

## 2016-02-06 DIAGNOSIS — R627 Adult failure to thrive: Secondary | ICD-10-CM | POA: Diagnosis not present

## 2016-02-06 DIAGNOSIS — I69051 Hemiplegia and hemiparesis following nontraumatic subarachnoid hemorrhage affecting right dominant side: Secondary | ICD-10-CM | POA: Diagnosis not present

## 2016-02-06 DIAGNOSIS — K5901 Slow transit constipation: Secondary | ICD-10-CM | POA: Diagnosis not present

## 2016-02-06 DIAGNOSIS — I6990 Unspecified sequelae of unspecified cerebrovascular disease: Secondary | ICD-10-CM | POA: Diagnosis not present

## 2016-02-06 DIAGNOSIS — G309 Alzheimer's disease, unspecified: Secondary | ICD-10-CM | POA: Diagnosis not present

## 2016-02-07 ENCOUNTER — Ambulatory Visit (INDEPENDENT_AMBULATORY_CARE_PROVIDER_SITE_OTHER): Payer: Medicare Other | Admitting: Internal Medicine

## 2016-02-07 ENCOUNTER — Encounter: Payer: Self-pay | Admitting: Internal Medicine

## 2016-02-07 VITALS — BP 160/80 | HR 60

## 2016-02-07 DIAGNOSIS — R54 Age-related physical debility: Secondary | ICD-10-CM

## 2016-02-07 DIAGNOSIS — K5901 Slow transit constipation: Secondary | ICD-10-CM | POA: Diagnosis not present

## 2016-02-07 DIAGNOSIS — M1990 Unspecified osteoarthritis, unspecified site: Secondary | ICD-10-CM

## 2016-02-07 DIAGNOSIS — G4700 Insomnia, unspecified: Secondary | ICD-10-CM

## 2016-02-07 DIAGNOSIS — Z23 Encounter for immunization: Secondary | ICD-10-CM

## 2016-02-07 DIAGNOSIS — F01518 Vascular dementia, unspecified severity, with other behavioral disturbance: Secondary | ICD-10-CM

## 2016-02-07 DIAGNOSIS — G8190 Hemiplegia, unspecified affecting unspecified side: Secondary | ICD-10-CM

## 2016-02-07 DIAGNOSIS — F0151 Vascular dementia with behavioral disturbance: Secondary | ICD-10-CM

## 2016-02-07 DIAGNOSIS — R627 Adult failure to thrive: Secondary | ICD-10-CM

## 2016-02-07 MED ORDER — MORPHINE SULFATE (CONCENTRATE) 20 MG/ML PO SOLN: 5.0000 mg | Freq: Every evening | ORAL | 0 refills | Status: DC | PRN

## 2016-02-07 NOTE — Progress Notes (Signed)
[facility] Provider:  Davine Sweney L. Renato Gailseed, D.O., C.M.D.  Code Status: DNR, on hospice care now  Goals of Care:  Advanced Directives 02/07/2016  Does patient have an advance directive? Yes  Type of Estate agentAdvance Directive Healthcare Power of [place];Out of facility DNR (pink MOST or yellow form)  Does patient want to make changes to advanced directive? -  Copy of advanced directive(s) in chart? Yes  Pre-existing out of facility DNR order (yellow form or pink MOST form) -   Chief Complaint  Patient presents with  . Medical Management of Chronic Issues    4 mth follow-up    HPI: Patient is a 82 y.o. female seen today for medical management of chronic diseases.    Cannot walk anymore, but hollers a lot.  Happens early am, late at night, whenever.  Says "Oh lord".  She has to be moved a lot more and Britta MccreedyBarbara thinks she is sore.    Discussed medications:  Agreed to stop zocor, evista mvi, ca with D, plavix.    She is aspirating more often and "gets choked."    Past Medical History:  Diagnosis Date  . Abnormality of gait   . Acute posthemorrhagic anemia   . Allergic rhinitis due to pollen   . Alzheimer's dementia   . Anemia   . Confusion   . Dementia in conditions classified elsewhere without behavioral disturbance   . Hemorrhage of rectum and anus   . Hyperlipidemia   . Hypertension   . Memory loss   . Myalgia and myositis, unspecified   . Occlusion and stenosis of carotid artery without mention of cerebral infarction   . Open fracture of unspecified part of neck of femur   . Osteoporosis, unspecified   . Other and unspecified hyperlipidemia   . Pain in joint, lower leg   . Pain in joint, pelvic region and thigh   . Pain in joint, pelvic region and thigh   . Peripheral vascular disease, unspecified (HCC)   . Personal history of fall   . Spastic hemiplegia affecting dominant side (HCC)   . Stroke (HCC)   . Unspecified constipation   . Unspecified essential hypertension    . Unspecified late effects of cerebrovascular disease   . Unspecified urinary incontinence   . Urinary tract infection, site not specified   . Vascular dementia, uncomplicated     Past Surgical History:  Procedure Laterality Date  . ABDOMINAL HYSTERECTOMY    . PARTIAL HIP ARTHROPLASTY      No Known Allergies    Medication List       Accurate as of 02/07/16 10:17 AM. Always use your most recent med list.          acetaminophen 500 MG tablet Commonly known as:  TYLENOL Take 1 tablet (500 mg total) by mouth 3 (three) times daily.   Calcium-Vitamin D 600-400 MG-UNIT Tabs Take 1 tablet by mouth daily.   clopidogrel 75 MG tablet Commonly known as:  PLAVIX TAKE 1 TABLET BY MOUTH EVERY DAY   mirtazapine 30 MG tablet Commonly known as:  REMERON Take 1 tablet (30 mg total) by mouth at bedtime.   multivitamin with minerals Tabs tablet Take 1 tablet by mouth daily.   polyethylene glycol packet Commonly known as:  MIRALAX / GLYCOLAX Take 17 g by mouth daily as needed. For constipation   raloxifene 60 MG tablet Commonly known as:  EVISTA TK 1 T PO QD   raloxifene 60 MG tablet Commonly  known as:  EVISTA TAKE 1 TABLET BY MOUTH EVERY DAY   simvastatin 40 MG tablet Commonly known as:  ZOCOR TAKE 1 TABLET BY MOUTH EVERY DAY FOR CHOLESTEROL       Review of Systems:  Review of Systems  Constitutional: Positive for malaise/fatigue and weight loss. Negative for chills and fever.  HENT: Negative for hearing loss.   Eyes: Negative for blurred vision.  Respiratory: Negative for cough and shortness of breath.   Cardiovascular: Negative for chest pain, palpitations and leg swelling.  Gastrointestinal: Positive for constipation. Negative for abdominal pain, blood in stool and melena.  Genitourinary: Negative for dysuria.  Musculoskeletal: Positive for joint pain. Negative for falls.  Skin: Negative for itching and rash.  Neurological: Positive for sensory change, speech  change, focal weakness and weakness. Negative for dizziness and loss of consciousness.  Endo/Heme/Allergies: Does not bruise/bleed easily.  Psychiatric/Behavioral: Positive for memory loss. Negative for depression.    Health Maintenance  Topic Date Due  . INFLUENZA VACCINE  01/01/2016  . TETANUS/TDAP  07/28/2023  . DEXA SCAN  Completed  . ZOSTAVAX  Completed  . PNA vac Low Risk Adult  Completed    Physical Exam: Vitals:   02/07/16 1007  BP: (!) 160/80  Pulse: 60  SpO2: 94%   There is no height or weight on file to calculate BMI. Physical Exam  Constitutional: No distress.  Frail black female  Cardiovascular:  irreg irreg  Pulmonary/Chest: Effort normal and breath sounds normal. No respiratory distress.  Abdominal: Soft. Bowel sounds are normal.  Musculoskeletal:  Right hemiparesis; joint deformities of hands, knees  Neurological: She is alert.  Dysarthric speech  Skin: Skin is warm and dry.  Psychiatric:  Talking away to herself, rubbing her legs, repeatedly folding blanket in her lap and patting her dollbaby's behind    Labs reviewed: No longer getting labs  Procedures since last visit: No further labs and procedures  Assessment/Plan 1. Vascular dementia with behavioral disturbance -is end stage, still eats some but losing weight, failing to thrive -cont hospice care -d/c meds that are life-prolonging and do not help quality of life including osteoporosis meds, cholesterol meds, vitamins  2. Failure to thrive in adult -continue hospice care, appears she is still losing more weight  3. Frailty -is on hospice care now  4. Hemiparesis (HCC) -right-sided s/p stroke, has some pain from this also so start roxanol  5. Insomnia -continues on remeron, also add roxanol at bedtime  6. Slow transit constipation -cont miralax for her bowels for now  7. Arthritis -counseled on giving the tylenol regularly three times daily not just twice a day and start roxanol  5mg  at hs as needed for anxiety/pain/sleep  8. Need for prophylactic vaccination and inoculation against influenza -flu shot given  Labs/tests ordered:  Flu shot given Next appt:  04/21/2016   Samon Dishner L. Noga Fogg, D.O. Geriatrics [facility] [address], [place] [address] Cell Phone (Mon-Fri 8am-5pm):  [phone] On Call:  [phone] & follow prompts after 5pm & weekends Office Phone:  [phone] Office Fax:  [phone]

## 2016-02-08 DIAGNOSIS — G309 Alzheimer's disease, unspecified: Secondary | ICD-10-CM | POA: Diagnosis not present

## 2016-02-08 DIAGNOSIS — I69920 Aphasia following unspecified cerebrovascular disease: Secondary | ICD-10-CM | POA: Diagnosis not present

## 2016-02-08 DIAGNOSIS — R627 Adult failure to thrive: Secondary | ICD-10-CM | POA: Diagnosis not present

## 2016-02-08 DIAGNOSIS — I6990 Unspecified sequelae of unspecified cerebrovascular disease: Secondary | ICD-10-CM | POA: Diagnosis not present

## 2016-02-08 DIAGNOSIS — I69051 Hemiplegia and hemiparesis following nontraumatic subarachnoid hemorrhage affecting right dominant side: Secondary | ICD-10-CM | POA: Diagnosis not present

## 2016-02-08 DIAGNOSIS — K5901 Slow transit constipation: Secondary | ICD-10-CM | POA: Diagnosis not present

## 2016-02-12 DIAGNOSIS — I69051 Hemiplegia and hemiparesis following nontraumatic subarachnoid hemorrhage affecting right dominant side: Secondary | ICD-10-CM | POA: Diagnosis not present

## 2016-02-12 DIAGNOSIS — R627 Adult failure to thrive: Secondary | ICD-10-CM | POA: Diagnosis not present

## 2016-02-12 DIAGNOSIS — I69920 Aphasia following unspecified cerebrovascular disease: Secondary | ICD-10-CM | POA: Diagnosis not present

## 2016-02-12 DIAGNOSIS — G309 Alzheimer's disease, unspecified: Secondary | ICD-10-CM | POA: Diagnosis not present

## 2016-02-12 DIAGNOSIS — K5901 Slow transit constipation: Secondary | ICD-10-CM | POA: Diagnosis not present

## 2016-02-12 DIAGNOSIS — I6990 Unspecified sequelae of unspecified cerebrovascular disease: Secondary | ICD-10-CM | POA: Diagnosis not present

## 2016-02-14 DIAGNOSIS — R627 Adult failure to thrive: Secondary | ICD-10-CM | POA: Diagnosis not present

## 2016-02-14 DIAGNOSIS — I6990 Unspecified sequelae of unspecified cerebrovascular disease: Secondary | ICD-10-CM | POA: Diagnosis not present

## 2016-02-14 DIAGNOSIS — G309 Alzheimer's disease, unspecified: Secondary | ICD-10-CM | POA: Diagnosis not present

## 2016-02-14 DIAGNOSIS — K5901 Slow transit constipation: Secondary | ICD-10-CM | POA: Diagnosis not present

## 2016-02-14 DIAGNOSIS — I69920 Aphasia following unspecified cerebrovascular disease: Secondary | ICD-10-CM | POA: Diagnosis not present

## 2016-02-14 DIAGNOSIS — I69051 Hemiplegia and hemiparesis following nontraumatic subarachnoid hemorrhage affecting right dominant side: Secondary | ICD-10-CM | POA: Diagnosis not present

## 2016-02-15 DIAGNOSIS — I69051 Hemiplegia and hemiparesis following nontraumatic subarachnoid hemorrhage affecting right dominant side: Secondary | ICD-10-CM | POA: Diagnosis not present

## 2016-02-15 DIAGNOSIS — I69920 Aphasia following unspecified cerebrovascular disease: Secondary | ICD-10-CM | POA: Diagnosis not present

## 2016-02-15 DIAGNOSIS — R627 Adult failure to thrive: Secondary | ICD-10-CM | POA: Diagnosis not present

## 2016-02-15 DIAGNOSIS — I6990 Unspecified sequelae of unspecified cerebrovascular disease: Secondary | ICD-10-CM | POA: Diagnosis not present

## 2016-02-15 DIAGNOSIS — G309 Alzheimer's disease, unspecified: Secondary | ICD-10-CM | POA: Diagnosis not present

## 2016-02-15 DIAGNOSIS — K5901 Slow transit constipation: Secondary | ICD-10-CM | POA: Diagnosis not present

## 2016-02-20 DIAGNOSIS — I69051 Hemiplegia and hemiparesis following nontraumatic subarachnoid hemorrhage affecting right dominant side: Secondary | ICD-10-CM | POA: Diagnosis not present

## 2016-02-20 DIAGNOSIS — K5901 Slow transit constipation: Secondary | ICD-10-CM | POA: Diagnosis not present

## 2016-02-20 DIAGNOSIS — G309 Alzheimer's disease, unspecified: Secondary | ICD-10-CM | POA: Diagnosis not present

## 2016-02-20 DIAGNOSIS — I69920 Aphasia following unspecified cerebrovascular disease: Secondary | ICD-10-CM | POA: Diagnosis not present

## 2016-02-20 DIAGNOSIS — I6990 Unspecified sequelae of unspecified cerebrovascular disease: Secondary | ICD-10-CM | POA: Diagnosis not present

## 2016-02-20 DIAGNOSIS — R627 Adult failure to thrive: Secondary | ICD-10-CM | POA: Diagnosis not present

## 2016-02-21 DIAGNOSIS — R627 Adult failure to thrive: Secondary | ICD-10-CM | POA: Diagnosis not present

## 2016-02-21 DIAGNOSIS — G309 Alzheimer's disease, unspecified: Secondary | ICD-10-CM | POA: Diagnosis not present

## 2016-02-21 DIAGNOSIS — I69051 Hemiplegia and hemiparesis following nontraumatic subarachnoid hemorrhage affecting right dominant side: Secondary | ICD-10-CM | POA: Diagnosis not present

## 2016-02-21 DIAGNOSIS — K5901 Slow transit constipation: Secondary | ICD-10-CM | POA: Diagnosis not present

## 2016-02-21 DIAGNOSIS — I69920 Aphasia following unspecified cerebrovascular disease: Secondary | ICD-10-CM | POA: Diagnosis not present

## 2016-02-21 DIAGNOSIS — I6990 Unspecified sequelae of unspecified cerebrovascular disease: Secondary | ICD-10-CM | POA: Diagnosis not present

## 2016-02-22 DIAGNOSIS — I69920 Aphasia following unspecified cerebrovascular disease: Secondary | ICD-10-CM | POA: Diagnosis not present

## 2016-02-22 DIAGNOSIS — K5901 Slow transit constipation: Secondary | ICD-10-CM | POA: Diagnosis not present

## 2016-02-22 DIAGNOSIS — I69051 Hemiplegia and hemiparesis following nontraumatic subarachnoid hemorrhage affecting right dominant side: Secondary | ICD-10-CM | POA: Diagnosis not present

## 2016-02-22 DIAGNOSIS — G309 Alzheimer's disease, unspecified: Secondary | ICD-10-CM | POA: Diagnosis not present

## 2016-02-22 DIAGNOSIS — I6990 Unspecified sequelae of unspecified cerebrovascular disease: Secondary | ICD-10-CM | POA: Diagnosis not present

## 2016-02-22 DIAGNOSIS — R627 Adult failure to thrive: Secondary | ICD-10-CM | POA: Diagnosis not present

## 2016-02-26 DIAGNOSIS — I6990 Unspecified sequelae of unspecified cerebrovascular disease: Secondary | ICD-10-CM | POA: Diagnosis not present

## 2016-02-26 DIAGNOSIS — K5901 Slow transit constipation: Secondary | ICD-10-CM | POA: Diagnosis not present

## 2016-02-26 DIAGNOSIS — I69920 Aphasia following unspecified cerebrovascular disease: Secondary | ICD-10-CM | POA: Diagnosis not present

## 2016-02-26 DIAGNOSIS — R627 Adult failure to thrive: Secondary | ICD-10-CM | POA: Diagnosis not present

## 2016-02-26 DIAGNOSIS — I69051 Hemiplegia and hemiparesis following nontraumatic subarachnoid hemorrhage affecting right dominant side: Secondary | ICD-10-CM | POA: Diagnosis not present

## 2016-02-26 DIAGNOSIS — G309 Alzheimer's disease, unspecified: Secondary | ICD-10-CM | POA: Diagnosis not present

## 2016-02-28 DIAGNOSIS — G309 Alzheimer's disease, unspecified: Secondary | ICD-10-CM | POA: Diagnosis not present

## 2016-02-28 DIAGNOSIS — I69051 Hemiplegia and hemiparesis following nontraumatic subarachnoid hemorrhage affecting right dominant side: Secondary | ICD-10-CM | POA: Diagnosis not present

## 2016-02-28 DIAGNOSIS — R627 Adult failure to thrive: Secondary | ICD-10-CM | POA: Diagnosis not present

## 2016-02-28 DIAGNOSIS — K5901 Slow transit constipation: Secondary | ICD-10-CM | POA: Diagnosis not present

## 2016-02-28 DIAGNOSIS — I6990 Unspecified sequelae of unspecified cerebrovascular disease: Secondary | ICD-10-CM | POA: Diagnosis not present

## 2016-02-28 DIAGNOSIS — I69920 Aphasia following unspecified cerebrovascular disease: Secondary | ICD-10-CM | POA: Diagnosis not present

## 2016-03-02 DIAGNOSIS — F0281 Dementia in other diseases classified elsewhere with behavioral disturbance: Secondary | ICD-10-CM | POA: Diagnosis not present

## 2016-03-02 DIAGNOSIS — F0151 Vascular dementia with behavioral disturbance: Secondary | ICD-10-CM | POA: Diagnosis not present

## 2016-03-02 DIAGNOSIS — I69920 Aphasia following unspecified cerebrovascular disease: Secondary | ICD-10-CM | POA: Diagnosis not present

## 2016-03-02 DIAGNOSIS — K5901 Slow transit constipation: Secondary | ICD-10-CM | POA: Diagnosis not present

## 2016-03-02 DIAGNOSIS — I69051 Hemiplegia and hemiparesis following nontraumatic subarachnoid hemorrhage affecting right dominant side: Secondary | ICD-10-CM | POA: Diagnosis not present

## 2016-03-02 DIAGNOSIS — I6990 Unspecified sequelae of unspecified cerebrovascular disease: Secondary | ICD-10-CM | POA: Diagnosis not present

## 2016-03-02 DIAGNOSIS — G309 Alzheimer's disease, unspecified: Secondary | ICD-10-CM | POA: Diagnosis not present

## 2016-03-02 DIAGNOSIS — R627 Adult failure to thrive: Secondary | ICD-10-CM | POA: Diagnosis not present

## 2016-03-04 DIAGNOSIS — I6990 Unspecified sequelae of unspecified cerebrovascular disease: Secondary | ICD-10-CM | POA: Diagnosis not present

## 2016-03-04 DIAGNOSIS — G309 Alzheimer's disease, unspecified: Secondary | ICD-10-CM | POA: Diagnosis not present

## 2016-03-04 DIAGNOSIS — I69051 Hemiplegia and hemiparesis following nontraumatic subarachnoid hemorrhage affecting right dominant side: Secondary | ICD-10-CM | POA: Diagnosis not present

## 2016-03-04 DIAGNOSIS — K5901 Slow transit constipation: Secondary | ICD-10-CM | POA: Diagnosis not present

## 2016-03-04 DIAGNOSIS — I69920 Aphasia following unspecified cerebrovascular disease: Secondary | ICD-10-CM | POA: Diagnosis not present

## 2016-03-04 DIAGNOSIS — R627 Adult failure to thrive: Secondary | ICD-10-CM | POA: Diagnosis not present

## 2016-03-06 DIAGNOSIS — I69051 Hemiplegia and hemiparesis following nontraumatic subarachnoid hemorrhage affecting right dominant side: Secondary | ICD-10-CM | POA: Diagnosis not present

## 2016-03-06 DIAGNOSIS — G309 Alzheimer's disease, unspecified: Secondary | ICD-10-CM | POA: Diagnosis not present

## 2016-03-06 DIAGNOSIS — I69920 Aphasia following unspecified cerebrovascular disease: Secondary | ICD-10-CM | POA: Diagnosis not present

## 2016-03-06 DIAGNOSIS — K5901 Slow transit constipation: Secondary | ICD-10-CM | POA: Diagnosis not present

## 2016-03-06 DIAGNOSIS — R627 Adult failure to thrive: Secondary | ICD-10-CM | POA: Diagnosis not present

## 2016-03-06 DIAGNOSIS — I6990 Unspecified sequelae of unspecified cerebrovascular disease: Secondary | ICD-10-CM | POA: Diagnosis not present

## 2016-03-07 DIAGNOSIS — R627 Adult failure to thrive: Secondary | ICD-10-CM | POA: Diagnosis not present

## 2016-03-07 DIAGNOSIS — K5901 Slow transit constipation: Secondary | ICD-10-CM | POA: Diagnosis not present

## 2016-03-07 DIAGNOSIS — I6990 Unspecified sequelae of unspecified cerebrovascular disease: Secondary | ICD-10-CM | POA: Diagnosis not present

## 2016-03-07 DIAGNOSIS — G309 Alzheimer's disease, unspecified: Secondary | ICD-10-CM | POA: Diagnosis not present

## 2016-03-07 DIAGNOSIS — I69051 Hemiplegia and hemiparesis following nontraumatic subarachnoid hemorrhage affecting right dominant side: Secondary | ICD-10-CM | POA: Diagnosis not present

## 2016-03-07 DIAGNOSIS — I69920 Aphasia following unspecified cerebrovascular disease: Secondary | ICD-10-CM | POA: Diagnosis not present

## 2016-03-11 ENCOUNTER — Other Ambulatory Visit: Payer: Self-pay | Admitting: *Deleted

## 2016-03-11 ENCOUNTER — Telehealth: Payer: Self-pay

## 2016-03-11 DIAGNOSIS — K5901 Slow transit constipation: Secondary | ICD-10-CM | POA: Diagnosis not present

## 2016-03-11 DIAGNOSIS — I69920 Aphasia following unspecified cerebrovascular disease: Secondary | ICD-10-CM | POA: Diagnosis not present

## 2016-03-11 DIAGNOSIS — I6990 Unspecified sequelae of unspecified cerebrovascular disease: Secondary | ICD-10-CM | POA: Diagnosis not present

## 2016-03-11 DIAGNOSIS — I69051 Hemiplegia and hemiparesis following nontraumatic subarachnoid hemorrhage affecting right dominant side: Secondary | ICD-10-CM | POA: Diagnosis not present

## 2016-03-11 DIAGNOSIS — G309 Alzheimer's disease, unspecified: Secondary | ICD-10-CM | POA: Diagnosis not present

## 2016-03-11 DIAGNOSIS — R627 Adult failure to thrive: Secondary | ICD-10-CM | POA: Diagnosis not present

## 2016-03-11 MED ORDER — MORPHINE SULFATE (CONCENTRATE) 20 MG/ML PO SOLN: 5.0000 mg | Freq: Every evening | ORAL | 0 refills | Status: DC | PRN

## 2016-03-11 NOTE — Telephone Encounter (Signed)
Patient caregiver requested and will pick up 

## 2016-03-11 NOTE — Telephone Encounter (Signed)
I called patient to let her know that a prescription was ready to be picked up at the office.  Rx is for morphine (Roxanol) 20 mg/ml concentrated solution. Take 0.25 ml (5 mg total) by mouth at bedtime as needed for anxiety (and pain).   Rx was placed in filing cabinet at front desk.

## 2016-03-12 DIAGNOSIS — I6990 Unspecified sequelae of unspecified cerebrovascular disease: Secondary | ICD-10-CM | POA: Diagnosis not present

## 2016-03-12 DIAGNOSIS — G309 Alzheimer's disease, unspecified: Secondary | ICD-10-CM | POA: Diagnosis not present

## 2016-03-12 DIAGNOSIS — K5901 Slow transit constipation: Secondary | ICD-10-CM | POA: Diagnosis not present

## 2016-03-12 DIAGNOSIS — I69920 Aphasia following unspecified cerebrovascular disease: Secondary | ICD-10-CM | POA: Diagnosis not present

## 2016-03-12 DIAGNOSIS — I69051 Hemiplegia and hemiparesis following nontraumatic subarachnoid hemorrhage affecting right dominant side: Secondary | ICD-10-CM | POA: Diagnosis not present

## 2016-03-12 DIAGNOSIS — R627 Adult failure to thrive: Secondary | ICD-10-CM | POA: Diagnosis not present

## 2016-03-14 DIAGNOSIS — I6990 Unspecified sequelae of unspecified cerebrovascular disease: Secondary | ICD-10-CM | POA: Diagnosis not present

## 2016-03-14 DIAGNOSIS — R627 Adult failure to thrive: Secondary | ICD-10-CM | POA: Diagnosis not present

## 2016-03-14 DIAGNOSIS — G309 Alzheimer's disease, unspecified: Secondary | ICD-10-CM | POA: Diagnosis not present

## 2016-03-14 DIAGNOSIS — I69920 Aphasia following unspecified cerebrovascular disease: Secondary | ICD-10-CM | POA: Diagnosis not present

## 2016-03-14 DIAGNOSIS — K5901 Slow transit constipation: Secondary | ICD-10-CM | POA: Diagnosis not present

## 2016-03-14 DIAGNOSIS — I69051 Hemiplegia and hemiparesis following nontraumatic subarachnoid hemorrhage affecting right dominant side: Secondary | ICD-10-CM | POA: Diagnosis not present

## 2016-03-18 DIAGNOSIS — I6990 Unspecified sequelae of unspecified cerebrovascular disease: Secondary | ICD-10-CM | POA: Diagnosis not present

## 2016-03-18 DIAGNOSIS — G309 Alzheimer's disease, unspecified: Secondary | ICD-10-CM | POA: Diagnosis not present

## 2016-03-18 DIAGNOSIS — K5901 Slow transit constipation: Secondary | ICD-10-CM | POA: Diagnosis not present

## 2016-03-18 DIAGNOSIS — I69920 Aphasia following unspecified cerebrovascular disease: Secondary | ICD-10-CM | POA: Diagnosis not present

## 2016-03-18 DIAGNOSIS — R627 Adult failure to thrive: Secondary | ICD-10-CM | POA: Diagnosis not present

## 2016-03-18 DIAGNOSIS — I69051 Hemiplegia and hemiparesis following nontraumatic subarachnoid hemorrhage affecting right dominant side: Secondary | ICD-10-CM | POA: Diagnosis not present

## 2016-03-19 DIAGNOSIS — G309 Alzheimer's disease, unspecified: Secondary | ICD-10-CM | POA: Diagnosis not present

## 2016-03-19 DIAGNOSIS — I69920 Aphasia following unspecified cerebrovascular disease: Secondary | ICD-10-CM | POA: Diagnosis not present

## 2016-03-19 DIAGNOSIS — R627 Adult failure to thrive: Secondary | ICD-10-CM | POA: Diagnosis not present

## 2016-03-19 DIAGNOSIS — K5901 Slow transit constipation: Secondary | ICD-10-CM | POA: Diagnosis not present

## 2016-03-19 DIAGNOSIS — I6990 Unspecified sequelae of unspecified cerebrovascular disease: Secondary | ICD-10-CM | POA: Diagnosis not present

## 2016-03-19 DIAGNOSIS — I69051 Hemiplegia and hemiparesis following nontraumatic subarachnoid hemorrhage affecting right dominant side: Secondary | ICD-10-CM | POA: Diagnosis not present

## 2016-03-21 DIAGNOSIS — I69920 Aphasia following unspecified cerebrovascular disease: Secondary | ICD-10-CM | POA: Diagnosis not present

## 2016-03-21 DIAGNOSIS — I6990 Unspecified sequelae of unspecified cerebrovascular disease: Secondary | ICD-10-CM | POA: Diagnosis not present

## 2016-03-21 DIAGNOSIS — K5901 Slow transit constipation: Secondary | ICD-10-CM | POA: Diagnosis not present

## 2016-03-21 DIAGNOSIS — I69051 Hemiplegia and hemiparesis following nontraumatic subarachnoid hemorrhage affecting right dominant side: Secondary | ICD-10-CM | POA: Diagnosis not present

## 2016-03-21 DIAGNOSIS — R627 Adult failure to thrive: Secondary | ICD-10-CM | POA: Diagnosis not present

## 2016-03-21 DIAGNOSIS — G309 Alzheimer's disease, unspecified: Secondary | ICD-10-CM | POA: Diagnosis not present

## 2016-03-25 DIAGNOSIS — K5901 Slow transit constipation: Secondary | ICD-10-CM | POA: Diagnosis not present

## 2016-03-25 DIAGNOSIS — R627 Adult failure to thrive: Secondary | ICD-10-CM | POA: Diagnosis not present

## 2016-03-25 DIAGNOSIS — G309 Alzheimer's disease, unspecified: Secondary | ICD-10-CM | POA: Diagnosis not present

## 2016-03-25 DIAGNOSIS — I6990 Unspecified sequelae of unspecified cerebrovascular disease: Secondary | ICD-10-CM | POA: Diagnosis not present

## 2016-03-25 DIAGNOSIS — I69051 Hemiplegia and hemiparesis following nontraumatic subarachnoid hemorrhage affecting right dominant side: Secondary | ICD-10-CM | POA: Diagnosis not present

## 2016-03-25 DIAGNOSIS — I69920 Aphasia following unspecified cerebrovascular disease: Secondary | ICD-10-CM | POA: Diagnosis not present

## 2016-03-28 DIAGNOSIS — I69920 Aphasia following unspecified cerebrovascular disease: Secondary | ICD-10-CM | POA: Diagnosis not present

## 2016-03-28 DIAGNOSIS — I6990 Unspecified sequelae of unspecified cerebrovascular disease: Secondary | ICD-10-CM | POA: Diagnosis not present

## 2016-03-28 DIAGNOSIS — K5901 Slow transit constipation: Secondary | ICD-10-CM | POA: Diagnosis not present

## 2016-03-28 DIAGNOSIS — I69051 Hemiplegia and hemiparesis following nontraumatic subarachnoid hemorrhage affecting right dominant side: Secondary | ICD-10-CM | POA: Diagnosis not present

## 2016-03-28 DIAGNOSIS — G309 Alzheimer's disease, unspecified: Secondary | ICD-10-CM | POA: Diagnosis not present

## 2016-03-28 DIAGNOSIS — R627 Adult failure to thrive: Secondary | ICD-10-CM | POA: Diagnosis not present

## 2016-04-01 DIAGNOSIS — I6990 Unspecified sequelae of unspecified cerebrovascular disease: Secondary | ICD-10-CM | POA: Diagnosis not present

## 2016-04-01 DIAGNOSIS — G309 Alzheimer's disease, unspecified: Secondary | ICD-10-CM | POA: Diagnosis not present

## 2016-04-01 DIAGNOSIS — I69920 Aphasia following unspecified cerebrovascular disease: Secondary | ICD-10-CM | POA: Diagnosis not present

## 2016-04-01 DIAGNOSIS — K5901 Slow transit constipation: Secondary | ICD-10-CM | POA: Diagnosis not present

## 2016-04-01 DIAGNOSIS — R627 Adult failure to thrive: Secondary | ICD-10-CM | POA: Diagnosis not present

## 2016-04-01 DIAGNOSIS — I69051 Hemiplegia and hemiparesis following nontraumatic subarachnoid hemorrhage affecting right dominant side: Secondary | ICD-10-CM | POA: Diagnosis not present

## 2016-04-02 DIAGNOSIS — K5901 Slow transit constipation: Secondary | ICD-10-CM | POA: Diagnosis not present

## 2016-04-02 DIAGNOSIS — F0281 Dementia in other diseases classified elsewhere with behavioral disturbance: Secondary | ICD-10-CM | POA: Diagnosis not present

## 2016-04-02 DIAGNOSIS — G309 Alzheimer's disease, unspecified: Secondary | ICD-10-CM | POA: Diagnosis not present

## 2016-04-02 DIAGNOSIS — F0151 Vascular dementia with behavioral disturbance: Secondary | ICD-10-CM | POA: Diagnosis not present

## 2016-04-02 DIAGNOSIS — I6990 Unspecified sequelae of unspecified cerebrovascular disease: Secondary | ICD-10-CM | POA: Diagnosis not present

## 2016-04-02 DIAGNOSIS — I69920 Aphasia following unspecified cerebrovascular disease: Secondary | ICD-10-CM | POA: Diagnosis not present

## 2016-04-02 DIAGNOSIS — R627 Adult failure to thrive: Secondary | ICD-10-CM | POA: Diagnosis not present

## 2016-04-02 DIAGNOSIS — I69051 Hemiplegia and hemiparesis following nontraumatic subarachnoid hemorrhage affecting right dominant side: Secondary | ICD-10-CM | POA: Diagnosis not present

## 2016-04-16 ENCOUNTER — Other Ambulatory Visit: Payer: Self-pay | Admitting: *Deleted

## 2016-04-16 MED ORDER — MORPHINE SULFATE (CONCENTRATE) 20 MG/ML PO SOLN: 5.0000 mg | Freq: Every evening | ORAL | 0 refills | Status: DC | PRN

## 2016-04-16 NOTE — Telephone Encounter (Signed)
Patient caregiver requested and Jamesetta OrleansLilly Bowman or Selena LesserCharlie Proto will Pick up

## 2016-04-21 ENCOUNTER — Ambulatory Visit: Payer: Medicare Other

## 2016-04-29 ENCOUNTER — Telehealth: Payer: Self-pay | Admitting: *Deleted

## 2016-04-29 NOTE — Telephone Encounter (Signed)
[facility] and requested an order for Anusol Suppositories as needed for Hemorrhoids. Needs Rx faxed to pharmacy if approved.  Please Advise.

## 2016-04-30 MED ORDER — HYDROCORTISONE ACETATE 25 MG RE SUPP: RECTAL | 5 refills | Status: DC

## 2016-04-30 NOTE — Telephone Encounter (Signed)
Rx faxed to pharmacy and [employer] Notified.

## 2016-04-30 NOTE — Telephone Encounter (Signed)
Agree with anusol suppositories daily prn hemorrhoid pain or bleeding, #5, 5 refills.  I didn't just fax it b/c I wasn't sure if they had a different pharmacy they wanted it sent to for hospice.

## 2016-05-02 DIAGNOSIS — I6990 Unspecified sequelae of unspecified cerebrovascular disease: Secondary | ICD-10-CM | POA: Diagnosis not present

## 2016-05-02 DIAGNOSIS — G309 Alzheimer's disease, unspecified: Secondary | ICD-10-CM | POA: Diagnosis not present

## 2016-05-02 DIAGNOSIS — I69920 Aphasia following unspecified cerebrovascular disease: Secondary | ICD-10-CM | POA: Diagnosis not present

## 2016-05-02 DIAGNOSIS — F0151 Vascular dementia with behavioral disturbance: Secondary | ICD-10-CM | POA: Diagnosis not present

## 2016-05-02 DIAGNOSIS — K5901 Slow transit constipation: Secondary | ICD-10-CM | POA: Diagnosis not present

## 2016-05-02 DIAGNOSIS — F0281 Dementia in other diseases classified elsewhere with behavioral disturbance: Secondary | ICD-10-CM | POA: Diagnosis not present

## 2016-05-02 DIAGNOSIS — R627 Adult failure to thrive: Secondary | ICD-10-CM | POA: Diagnosis not present

## 2016-05-02 DIAGNOSIS — I69051 Hemiplegia and hemiparesis following nontraumatic subarachnoid hemorrhage affecting right dominant side: Secondary | ICD-10-CM | POA: Diagnosis not present

## 2016-05-03 DIAGNOSIS — I69920 Aphasia following unspecified cerebrovascular disease: Secondary | ICD-10-CM | POA: Diagnosis not present

## 2016-05-03 DIAGNOSIS — K5901 Slow transit constipation: Secondary | ICD-10-CM | POA: Diagnosis not present

## 2016-05-03 DIAGNOSIS — R627 Adult failure to thrive: Secondary | ICD-10-CM | POA: Diagnosis not present

## 2016-05-03 DIAGNOSIS — G309 Alzheimer's disease, unspecified: Secondary | ICD-10-CM | POA: Diagnosis not present

## 2016-05-03 DIAGNOSIS — I6990 Unspecified sequelae of unspecified cerebrovascular disease: Secondary | ICD-10-CM | POA: Diagnosis not present

## 2016-05-03 DIAGNOSIS — I69051 Hemiplegia and hemiparesis following nontraumatic subarachnoid hemorrhage affecting right dominant side: Secondary | ICD-10-CM | POA: Diagnosis not present

## 2016-05-06 DIAGNOSIS — I69051 Hemiplegia and hemiparesis following nontraumatic subarachnoid hemorrhage affecting right dominant side: Secondary | ICD-10-CM | POA: Diagnosis not present

## 2016-05-06 DIAGNOSIS — I69920 Aphasia following unspecified cerebrovascular disease: Secondary | ICD-10-CM | POA: Diagnosis not present

## 2016-05-06 DIAGNOSIS — I6990 Unspecified sequelae of unspecified cerebrovascular disease: Secondary | ICD-10-CM | POA: Diagnosis not present

## 2016-05-06 DIAGNOSIS — K5901 Slow transit constipation: Secondary | ICD-10-CM | POA: Diagnosis not present

## 2016-05-06 DIAGNOSIS — G309 Alzheimer's disease, unspecified: Secondary | ICD-10-CM | POA: Diagnosis not present

## 2016-05-06 DIAGNOSIS — R627 Adult failure to thrive: Secondary | ICD-10-CM | POA: Diagnosis not present

## 2016-05-07 ENCOUNTER — Other Ambulatory Visit: Payer: Self-pay | Admitting: Internal Medicine

## 2016-05-07 DIAGNOSIS — I69051 Hemiplegia and hemiparesis following nontraumatic subarachnoid hemorrhage affecting right dominant side: Secondary | ICD-10-CM | POA: Diagnosis not present

## 2016-05-07 DIAGNOSIS — F01518 Vascular dementia, unspecified severity, with other behavioral disturbance: Secondary | ICD-10-CM

## 2016-05-07 DIAGNOSIS — R627 Adult failure to thrive: Secondary | ICD-10-CM | POA: Diagnosis not present

## 2016-05-07 DIAGNOSIS — F0151 Vascular dementia with behavioral disturbance: Secondary | ICD-10-CM

## 2016-05-07 DIAGNOSIS — I6990 Unspecified sequelae of unspecified cerebrovascular disease: Secondary | ICD-10-CM | POA: Diagnosis not present

## 2016-05-07 DIAGNOSIS — G309 Alzheimer's disease, unspecified: Secondary | ICD-10-CM | POA: Diagnosis not present

## 2016-05-07 DIAGNOSIS — K5901 Slow transit constipation: Secondary | ICD-10-CM | POA: Diagnosis not present

## 2016-05-07 DIAGNOSIS — I69920 Aphasia following unspecified cerebrovascular disease: Secondary | ICD-10-CM | POA: Diagnosis not present

## 2016-05-08 ENCOUNTER — Encounter: Payer: Self-pay | Admitting: Internal Medicine

## 2016-05-08 ENCOUNTER — Ambulatory Visit (INDEPENDENT_AMBULATORY_CARE_PROVIDER_SITE_OTHER): Payer: Medicare Other | Admitting: Internal Medicine

## 2016-05-08 VITALS — BP 120/60 | HR 69

## 2016-05-08 DIAGNOSIS — F0151 Vascular dementia with behavioral disturbance: Secondary | ICD-10-CM | POA: Diagnosis not present

## 2016-05-08 DIAGNOSIS — R54 Age-related physical debility: Secondary | ICD-10-CM | POA: Diagnosis not present

## 2016-05-08 DIAGNOSIS — R627 Adult failure to thrive: Secondary | ICD-10-CM | POA: Diagnosis not present

## 2016-05-08 DIAGNOSIS — F5104 Psychophysiologic insomnia: Secondary | ICD-10-CM

## 2016-05-08 DIAGNOSIS — I69920 Aphasia following unspecified cerebrovascular disease: Secondary | ICD-10-CM | POA: Diagnosis not present

## 2016-05-08 DIAGNOSIS — F01518 Vascular dementia, unspecified severity, with other behavioral disturbance: Secondary | ICD-10-CM

## 2016-05-08 DIAGNOSIS — K5901 Slow transit constipation: Secondary | ICD-10-CM

## 2016-05-08 DIAGNOSIS — G309 Alzheimer's disease, unspecified: Secondary | ICD-10-CM | POA: Diagnosis not present

## 2016-05-08 DIAGNOSIS — K648 Other hemorrhoids: Secondary | ICD-10-CM

## 2016-05-08 DIAGNOSIS — K644 Residual hemorrhoidal skin tags: Secondary | ICD-10-CM | POA: Diagnosis not present

## 2016-05-08 DIAGNOSIS — I6990 Unspecified sequelae of unspecified cerebrovascular disease: Secondary | ICD-10-CM | POA: Diagnosis not present

## 2016-05-08 DIAGNOSIS — I69051 Hemiplegia and hemiparesis following nontraumatic subarachnoid hemorrhage affecting right dominant side: Secondary | ICD-10-CM | POA: Diagnosis not present

## 2016-05-08 NOTE — Progress Notes (Signed)
[facility] Provider:  Kallin Henk L. Renato Gailseed, D.O., C.M.D.  Code Status: DNR Goals of Care:  Advanced Directives 05/08/2016  Does Patient Have a Medical Advance Directive? Yes  Type of Estate agentAdvance Directive Healthcare Power of [place];Out of facility DNR (pink MOST or yellow form)  Does patient want to make changes to medical advance directive? -  Copy of Healthcare Power of Attorney in Chart? Yes  Pre-existing out of facility DNR order (yellow form or pink MOST form) Yellow form placed in chart (order not valid for inpatient use)     Chief Complaint  Patient presents with  . Medical Management of Chronic Issues    3 mth follow-up    HPI: Patient is a 82 y.o. female seen today for medical management of chronic diseases.  She is on hospice care.  She was fighting her bp and POX would not read O2 b/c she would not keep it on her finger.  She is still not ambulatory.    Hemorrhoids are flaring up.  They won't stay up.  Unable to tell if she has pain.  She makes noises.  She does not say she hurts.  Britta MccreedyBarbara has been giving her suppositories at night and using prep H in the daytime.  When she's up to go on the pot, they are hanging down.      Her roxanol at hs 5mg --sometimes goes to sleep quickly.  This week she's been up after 12 midnight.  She still hollers some.  She does not like to get up in the morning after being up half the night.  She is in the hospital bed.  She is back to napping in the day again.  Appetite unchanged.    BMs still doing pretty well with miralax twice a week (more often, she leaks daily).  Goes just about daily.  May miss 2 days if she goes 4 days w/o miralax.    Past Medical History:  Diagnosis Date  . Abnormality of gait   . Acute posthemorrhagic anemia   . Allergic rhinitis due to pollen   . Alzheimer's dementia   . Anemia   . Confusion   . Dementia in conditions classified elsewhere without behavioral disturbance   . Hemorrhage of rectum and anus     . Hyperlipidemia   . Hypertension   . Memory loss   . Myalgia and myositis, unspecified   . Occlusion and stenosis of carotid artery without mention of cerebral infarction   . Open fracture of unspecified part of neck of femur   . Osteoporosis, unspecified   . Other and unspecified hyperlipidemia   . Pain in joint, lower leg   . Pain in joint, pelvic region and thigh   . Pain in joint, pelvic region and thigh   . Peripheral vascular disease, unspecified   . Personal history of fall   . Spastic hemiplegia affecting dominant side (HCC)   . Stroke (HCC)   . Unspecified constipation   . Unspecified essential hypertension   . Unspecified late effects of cerebrovascular disease   . Unspecified urinary incontinence   . Urinary tract infection, site not specified   . Vascular dementia, uncomplicated     Past Surgical History:  Procedure Laterality Date  . ABDOMINAL HYSTERECTOMY    . PARTIAL HIP ARTHROPLASTY      No Known Allergies    Medication List       Accurate as of 05/08/16 10:25 AM. Always use your most recent med list.  acetaminophen 500 MG tablet Commonly known as:  TYLENOL Take 1 tablet (500 mg total) by mouth 3 (three) times daily.   hydrocortisone 25 MG suppository Commonly known as:  ANUSOL-HC Rectally daily as needed for hemorrhoid pain or bleeding   mirtazapine 30 MG tablet Commonly known as:  REMERON TAKE 1 TABLET(30 MG) BY MOUTH AT BEDTIME   morphine 20 MG/ML concentrated solution Commonly known as:  ROXANOL Take 0.25 mLs (5 mg total) by mouth at bedtime as needed for anxiety (and pain).   polyethylene glycol packet Commonly known as:  MIRALAX / GLYCOLAX Take 17 g by mouth daily as needed. For constipation       Review of Systems:  Review of Systems  Reason unable to perform ROS: per family.  Constitutional: Positive for malaise/fatigue. Negative for chills and fever.  Respiratory: Negative for shortness of breath.   Cardiovascular:  Negative for chest pain, palpitations and leg swelling.  Gastrointestinal: Negative for abdominal pain.  Genitourinary: Negative for dysuria.       Hemorrhoids  Musculoskeletal: Positive for joint pain and myalgias. Negative for falls.  Skin: Negative for rash.  Neurological: Positive for weakness. Negative for dizziness and loss of consciousness.       Hemiparesis  Psychiatric/Behavioral: Positive for memory loss. Negative for depression. The patient has insomnia.     Health Maintenance  Topic Date Due  . TETANUS/TDAP  07/28/2023  . INFLUENZA VACCINE  Completed  . DEXA SCAN  Completed  . ZOSTAVAX  Completed  . PNA vac Low Risk Adult  Completed    Physical Exam: Vitals:   05/08/16 0959  BP: 120/60  Pulse: 69   There is no height or weight on file to calculate BMI. Physical Exam  Constitutional: No distress.  Cachectic female in her wheelchair, leans left, holding her dollbaby  Cardiovascular: Normal rate, regular rhythm, normal heart sounds and intact distal pulses.   Pulmonary/Chest: Effort normal and breath sounds normal. No respiratory distress.  Abdominal: Soft. Bowel sounds are normal.  hemorrhoids  Neurological: She is alert. She exhibits abnormal muscle tone.  Right hemiparesis; very strong left hand grip ; keeps head down, minimally verbal now--most speech too garbled to understand  Skin: Skin is warm and dry.    Labs reviewed: No longer doing labs due to goals being comfort, on hospice care  Assessment/Plan 1. Vascular dementia with behavioral disturbance -late stage, has lost weight over time (will not get up for weight--no longer ambulatory), minimally verbal, requiring help with all adls  2. Failure to thrive in adult -ongoing, slight decline in cognition since  3. Frailty -no longer ambulating, using wheelchair to get around  4. Internal and external hemorrhoids without complication -ongoing, more difficulty lately  5. Psychophysiological  insomnia -due to her dementia -sometimes sleeps better with hs morphine for her pain   6. Slow transit constipation -stable,-cont miralax routine   Labs/tests ordered: none per goals Next appt:  6 mos med mgt  Qamar Rosman L. Onika Gudiel, D.O. Geriatrics [facility] [address] [place], [place] [address] Cell Phone (Mon-Fri 8am-5pm):  [phone] On Call:  [phone] & follow prompts after 5pm & weekends Office Phone:  [phone] Office Fax:  [phone]

## 2016-05-09 DIAGNOSIS — I69051 Hemiplegia and hemiparesis following nontraumatic subarachnoid hemorrhage affecting right dominant side: Secondary | ICD-10-CM | POA: Diagnosis not present

## 2016-05-09 DIAGNOSIS — R627 Adult failure to thrive: Secondary | ICD-10-CM | POA: Diagnosis not present

## 2016-05-09 DIAGNOSIS — K5901 Slow transit constipation: Secondary | ICD-10-CM | POA: Diagnosis not present

## 2016-05-09 DIAGNOSIS — I69920 Aphasia following unspecified cerebrovascular disease: Secondary | ICD-10-CM | POA: Diagnosis not present

## 2016-05-09 DIAGNOSIS — I6990 Unspecified sequelae of unspecified cerebrovascular disease: Secondary | ICD-10-CM | POA: Diagnosis not present

## 2016-05-09 DIAGNOSIS — G309 Alzheimer's disease, unspecified: Secondary | ICD-10-CM | POA: Diagnosis not present

## 2016-05-12 DIAGNOSIS — G309 Alzheimer's disease, unspecified: Secondary | ICD-10-CM | POA: Diagnosis not present

## 2016-05-12 DIAGNOSIS — I69051 Hemiplegia and hemiparesis following nontraumatic subarachnoid hemorrhage affecting right dominant side: Secondary | ICD-10-CM | POA: Diagnosis not present

## 2016-05-12 DIAGNOSIS — I69920 Aphasia following unspecified cerebrovascular disease: Secondary | ICD-10-CM | POA: Diagnosis not present

## 2016-05-12 DIAGNOSIS — R627 Adult failure to thrive: Secondary | ICD-10-CM | POA: Diagnosis not present

## 2016-05-12 DIAGNOSIS — K5901 Slow transit constipation: Secondary | ICD-10-CM | POA: Diagnosis not present

## 2016-05-12 DIAGNOSIS — I6990 Unspecified sequelae of unspecified cerebrovascular disease: Secondary | ICD-10-CM | POA: Diagnosis not present

## 2016-05-13 DIAGNOSIS — I69051 Hemiplegia and hemiparesis following nontraumatic subarachnoid hemorrhage affecting right dominant side: Secondary | ICD-10-CM | POA: Diagnosis not present

## 2016-05-13 DIAGNOSIS — K5901 Slow transit constipation: Secondary | ICD-10-CM | POA: Diagnosis not present

## 2016-05-13 DIAGNOSIS — R627 Adult failure to thrive: Secondary | ICD-10-CM | POA: Diagnosis not present

## 2016-05-13 DIAGNOSIS — G309 Alzheimer's disease, unspecified: Secondary | ICD-10-CM | POA: Diagnosis not present

## 2016-05-13 DIAGNOSIS — I69920 Aphasia following unspecified cerebrovascular disease: Secondary | ICD-10-CM | POA: Diagnosis not present

## 2016-05-13 DIAGNOSIS — I6990 Unspecified sequelae of unspecified cerebrovascular disease: Secondary | ICD-10-CM | POA: Diagnosis not present

## 2016-05-15 ENCOUNTER — Other Ambulatory Visit: Payer: Self-pay | Admitting: *Deleted

## 2016-05-15 DIAGNOSIS — I69051 Hemiplegia and hemiparesis following nontraumatic subarachnoid hemorrhage affecting right dominant side: Secondary | ICD-10-CM | POA: Diagnosis not present

## 2016-05-15 DIAGNOSIS — R627 Adult failure to thrive: Secondary | ICD-10-CM | POA: Diagnosis not present

## 2016-05-15 DIAGNOSIS — I69920 Aphasia following unspecified cerebrovascular disease: Secondary | ICD-10-CM | POA: Diagnosis not present

## 2016-05-15 DIAGNOSIS — I6990 Unspecified sequelae of unspecified cerebrovascular disease: Secondary | ICD-10-CM | POA: Diagnosis not present

## 2016-05-15 DIAGNOSIS — G309 Alzheimer's disease, unspecified: Secondary | ICD-10-CM | POA: Diagnosis not present

## 2016-05-15 DIAGNOSIS — K5901 Slow transit constipation: Secondary | ICD-10-CM | POA: Diagnosis not present

## 2016-05-15 MED ORDER — MORPHINE SULFATE (CONCENTRATE) 20 MG/ML PO SOLN: 5.0000 mg | Freq: Every evening | ORAL | 0 refills | Status: DC | PRN

## 2016-05-15 NOTE — Telephone Encounter (Signed)
Patient daughter requested and will pick up 

## 2016-05-16 DIAGNOSIS — G309 Alzheimer's disease, unspecified: Secondary | ICD-10-CM | POA: Diagnosis not present

## 2016-05-16 DIAGNOSIS — R627 Adult failure to thrive: Secondary | ICD-10-CM | POA: Diagnosis not present

## 2016-05-16 DIAGNOSIS — I6990 Unspecified sequelae of unspecified cerebrovascular disease: Secondary | ICD-10-CM | POA: Diagnosis not present

## 2016-05-16 DIAGNOSIS — I69920 Aphasia following unspecified cerebrovascular disease: Secondary | ICD-10-CM | POA: Diagnosis not present

## 2016-05-16 DIAGNOSIS — K5901 Slow transit constipation: Secondary | ICD-10-CM | POA: Diagnosis not present

## 2016-05-16 DIAGNOSIS — I69051 Hemiplegia and hemiparesis following nontraumatic subarachnoid hemorrhage affecting right dominant side: Secondary | ICD-10-CM | POA: Diagnosis not present

## 2016-05-19 ENCOUNTER — Telehealth: Payer: Self-pay | Admitting: *Deleted

## 2016-05-19 NOTE — Telephone Encounter (Signed)
[name] with Hospice called and stated that they would like patient's Rx for Anusol changed to daily instead of PRN. Stated that the family is having to give it daily and sometimes in the evening to. Would like an updated Rx faxed to pharmacy. Please Advise.

## 2016-05-20 DIAGNOSIS — G309 Alzheimer's disease, unspecified: Secondary | ICD-10-CM | POA: Diagnosis not present

## 2016-05-20 DIAGNOSIS — I69920 Aphasia following unspecified cerebrovascular disease: Secondary | ICD-10-CM | POA: Diagnosis not present

## 2016-05-20 DIAGNOSIS — K5901 Slow transit constipation: Secondary | ICD-10-CM | POA: Diagnosis not present

## 2016-05-20 DIAGNOSIS — R627 Adult failure to thrive: Secondary | ICD-10-CM | POA: Diagnosis not present

## 2016-05-20 DIAGNOSIS — I6990 Unspecified sequelae of unspecified cerebrovascular disease: Secondary | ICD-10-CM | POA: Diagnosis not present

## 2016-05-20 DIAGNOSIS — I69051 Hemiplegia and hemiparesis following nontraumatic subarachnoid hemorrhage affecting right dominant side: Secondary | ICD-10-CM | POA: Diagnosis not present

## 2016-05-20 MED ORDER — HYDROCORTISONE ACETATE 25 MG RE SUPP: RECTAL | 1 refills | Status: DC

## 2016-05-20 NOTE — Telephone Encounter (Signed)
That's fine

## 2016-05-20 NOTE — Telephone Encounter (Signed)
Anusol updated and faxed to pharmacy.

## 2016-05-22 DIAGNOSIS — R627 Adult failure to thrive: Secondary | ICD-10-CM | POA: Diagnosis not present

## 2016-05-22 DIAGNOSIS — I6990 Unspecified sequelae of unspecified cerebrovascular disease: Secondary | ICD-10-CM | POA: Diagnosis not present

## 2016-05-22 DIAGNOSIS — G309 Alzheimer's disease, unspecified: Secondary | ICD-10-CM | POA: Diagnosis not present

## 2016-05-22 DIAGNOSIS — K5901 Slow transit constipation: Secondary | ICD-10-CM | POA: Diagnosis not present

## 2016-05-22 DIAGNOSIS — I69920 Aphasia following unspecified cerebrovascular disease: Secondary | ICD-10-CM | POA: Diagnosis not present

## 2016-05-22 DIAGNOSIS — I69051 Hemiplegia and hemiparesis following nontraumatic subarachnoid hemorrhage affecting right dominant side: Secondary | ICD-10-CM | POA: Diagnosis not present

## 2016-05-23 DIAGNOSIS — I69051 Hemiplegia and hemiparesis following nontraumatic subarachnoid hemorrhage affecting right dominant side: Secondary | ICD-10-CM | POA: Diagnosis not present

## 2016-05-23 DIAGNOSIS — K5901 Slow transit constipation: Secondary | ICD-10-CM | POA: Diagnosis not present

## 2016-05-23 DIAGNOSIS — R627 Adult failure to thrive: Secondary | ICD-10-CM | POA: Diagnosis not present

## 2016-05-23 DIAGNOSIS — G309 Alzheimer's disease, unspecified: Secondary | ICD-10-CM | POA: Diagnosis not present

## 2016-05-23 DIAGNOSIS — I69920 Aphasia following unspecified cerebrovascular disease: Secondary | ICD-10-CM | POA: Diagnosis not present

## 2016-05-23 DIAGNOSIS — I6990 Unspecified sequelae of unspecified cerebrovascular disease: Secondary | ICD-10-CM | POA: Diagnosis not present

## 2016-05-27 DIAGNOSIS — I69920 Aphasia following unspecified cerebrovascular disease: Secondary | ICD-10-CM | POA: Diagnosis not present

## 2016-05-27 DIAGNOSIS — K5901 Slow transit constipation: Secondary | ICD-10-CM | POA: Diagnosis not present

## 2016-05-27 DIAGNOSIS — I69051 Hemiplegia and hemiparesis following nontraumatic subarachnoid hemorrhage affecting right dominant side: Secondary | ICD-10-CM | POA: Diagnosis not present

## 2016-05-27 DIAGNOSIS — R627 Adult failure to thrive: Secondary | ICD-10-CM | POA: Diagnosis not present

## 2016-05-27 DIAGNOSIS — G309 Alzheimer's disease, unspecified: Secondary | ICD-10-CM | POA: Diagnosis not present

## 2016-05-27 DIAGNOSIS — I6990 Unspecified sequelae of unspecified cerebrovascular disease: Secondary | ICD-10-CM | POA: Diagnosis not present

## 2016-05-29 DIAGNOSIS — R627 Adult failure to thrive: Secondary | ICD-10-CM | POA: Diagnosis not present

## 2016-05-29 DIAGNOSIS — I6990 Unspecified sequelae of unspecified cerebrovascular disease: Secondary | ICD-10-CM | POA: Diagnosis not present

## 2016-05-29 DIAGNOSIS — I69051 Hemiplegia and hemiparesis following nontraumatic subarachnoid hemorrhage affecting right dominant side: Secondary | ICD-10-CM | POA: Diagnosis not present

## 2016-05-29 DIAGNOSIS — I69920 Aphasia following unspecified cerebrovascular disease: Secondary | ICD-10-CM | POA: Diagnosis not present

## 2016-05-29 DIAGNOSIS — K5901 Slow transit constipation: Secondary | ICD-10-CM | POA: Diagnosis not present

## 2016-05-29 DIAGNOSIS — G309 Alzheimer's disease, unspecified: Secondary | ICD-10-CM | POA: Diagnosis not present

## 2016-05-30 DIAGNOSIS — K5901 Slow transit constipation: Secondary | ICD-10-CM | POA: Diagnosis not present

## 2016-05-30 DIAGNOSIS — G309 Alzheimer's disease, unspecified: Secondary | ICD-10-CM | POA: Diagnosis not present

## 2016-05-30 DIAGNOSIS — I69051 Hemiplegia and hemiparesis following nontraumatic subarachnoid hemorrhage affecting right dominant side: Secondary | ICD-10-CM | POA: Diagnosis not present

## 2016-05-30 DIAGNOSIS — I6990 Unspecified sequelae of unspecified cerebrovascular disease: Secondary | ICD-10-CM | POA: Diagnosis not present

## 2016-05-30 DIAGNOSIS — I69920 Aphasia following unspecified cerebrovascular disease: Secondary | ICD-10-CM | POA: Diagnosis not present

## 2016-05-30 DIAGNOSIS — R627 Adult failure to thrive: Secondary | ICD-10-CM | POA: Diagnosis not present

## 2016-06-02 DIAGNOSIS — I69920 Aphasia following unspecified cerebrovascular disease: Secondary | ICD-10-CM | POA: Diagnosis not present

## 2016-06-02 DIAGNOSIS — I6990 Unspecified sequelae of unspecified cerebrovascular disease: Secondary | ICD-10-CM | POA: Diagnosis not present

## 2016-06-02 DIAGNOSIS — I69051 Hemiplegia and hemiparesis following nontraumatic subarachnoid hemorrhage affecting right dominant side: Secondary | ICD-10-CM | POA: Diagnosis not present

## 2016-06-02 DIAGNOSIS — R627 Adult failure to thrive: Secondary | ICD-10-CM | POA: Diagnosis not present

## 2016-06-02 DIAGNOSIS — F0151 Vascular dementia with behavioral disturbance: Secondary | ICD-10-CM | POA: Diagnosis not present

## 2016-06-02 DIAGNOSIS — K5901 Slow transit constipation: Secondary | ICD-10-CM | POA: Diagnosis not present

## 2016-06-02 DIAGNOSIS — G309 Alzheimer's disease, unspecified: Secondary | ICD-10-CM | POA: Diagnosis not present

## 2016-06-02 DIAGNOSIS — F0281 Dementia in other diseases classified elsewhere with behavioral disturbance: Secondary | ICD-10-CM | POA: Diagnosis not present

## 2016-06-03 DIAGNOSIS — I69051 Hemiplegia and hemiparesis following nontraumatic subarachnoid hemorrhage affecting right dominant side: Secondary | ICD-10-CM | POA: Diagnosis not present

## 2016-06-03 DIAGNOSIS — R627 Adult failure to thrive: Secondary | ICD-10-CM | POA: Diagnosis not present

## 2016-06-03 DIAGNOSIS — K5901 Slow transit constipation: Secondary | ICD-10-CM | POA: Diagnosis not present

## 2016-06-03 DIAGNOSIS — I69920 Aphasia following unspecified cerebrovascular disease: Secondary | ICD-10-CM | POA: Diagnosis not present

## 2016-06-03 DIAGNOSIS — I6990 Unspecified sequelae of unspecified cerebrovascular disease: Secondary | ICD-10-CM | POA: Diagnosis not present

## 2016-06-03 DIAGNOSIS — G309 Alzheimer's disease, unspecified: Secondary | ICD-10-CM | POA: Diagnosis not present

## 2016-06-04 DIAGNOSIS — I6990 Unspecified sequelae of unspecified cerebrovascular disease: Secondary | ICD-10-CM | POA: Diagnosis not present

## 2016-06-04 DIAGNOSIS — I69051 Hemiplegia and hemiparesis following nontraumatic subarachnoid hemorrhage affecting right dominant side: Secondary | ICD-10-CM | POA: Diagnosis not present

## 2016-06-04 DIAGNOSIS — K5901 Slow transit constipation: Secondary | ICD-10-CM | POA: Diagnosis not present

## 2016-06-04 DIAGNOSIS — R627 Adult failure to thrive: Secondary | ICD-10-CM | POA: Diagnosis not present

## 2016-06-04 DIAGNOSIS — G309 Alzheimer's disease, unspecified: Secondary | ICD-10-CM | POA: Diagnosis not present

## 2016-06-04 DIAGNOSIS — I69920 Aphasia following unspecified cerebrovascular disease: Secondary | ICD-10-CM | POA: Diagnosis not present

## 2016-06-05 DIAGNOSIS — I69051 Hemiplegia and hemiparesis following nontraumatic subarachnoid hemorrhage affecting right dominant side: Secondary | ICD-10-CM | POA: Diagnosis not present

## 2016-06-05 DIAGNOSIS — I69920 Aphasia following unspecified cerebrovascular disease: Secondary | ICD-10-CM | POA: Diagnosis not present

## 2016-06-05 DIAGNOSIS — R627 Adult failure to thrive: Secondary | ICD-10-CM | POA: Diagnosis not present

## 2016-06-05 DIAGNOSIS — G309 Alzheimer's disease, unspecified: Secondary | ICD-10-CM | POA: Diagnosis not present

## 2016-06-05 DIAGNOSIS — K5901 Slow transit constipation: Secondary | ICD-10-CM | POA: Diagnosis not present

## 2016-06-05 DIAGNOSIS — I6990 Unspecified sequelae of unspecified cerebrovascular disease: Secondary | ICD-10-CM | POA: Diagnosis not present

## 2016-06-06 DIAGNOSIS — I69051 Hemiplegia and hemiparesis following nontraumatic subarachnoid hemorrhage affecting right dominant side: Secondary | ICD-10-CM | POA: Diagnosis not present

## 2016-06-06 DIAGNOSIS — G309 Alzheimer's disease, unspecified: Secondary | ICD-10-CM | POA: Diagnosis not present

## 2016-06-06 DIAGNOSIS — R627 Adult failure to thrive: Secondary | ICD-10-CM | POA: Diagnosis not present

## 2016-06-06 DIAGNOSIS — K5901 Slow transit constipation: Secondary | ICD-10-CM | POA: Diagnosis not present

## 2016-06-06 DIAGNOSIS — I6990 Unspecified sequelae of unspecified cerebrovascular disease: Secondary | ICD-10-CM | POA: Diagnosis not present

## 2016-06-06 DIAGNOSIS — I69920 Aphasia following unspecified cerebrovascular disease: Secondary | ICD-10-CM | POA: Diagnosis not present

## 2016-06-10 DIAGNOSIS — G309 Alzheimer's disease, unspecified: Secondary | ICD-10-CM | POA: Diagnosis not present

## 2016-06-10 DIAGNOSIS — I6990 Unspecified sequelae of unspecified cerebrovascular disease: Secondary | ICD-10-CM | POA: Diagnosis not present

## 2016-06-10 DIAGNOSIS — I69051 Hemiplegia and hemiparesis following nontraumatic subarachnoid hemorrhage affecting right dominant side: Secondary | ICD-10-CM | POA: Diagnosis not present

## 2016-06-10 DIAGNOSIS — K5901 Slow transit constipation: Secondary | ICD-10-CM | POA: Diagnosis not present

## 2016-06-10 DIAGNOSIS — R627 Adult failure to thrive: Secondary | ICD-10-CM | POA: Diagnosis not present

## 2016-06-10 DIAGNOSIS — I69920 Aphasia following unspecified cerebrovascular disease: Secondary | ICD-10-CM | POA: Diagnosis not present

## 2016-06-11 DIAGNOSIS — R627 Adult failure to thrive: Secondary | ICD-10-CM | POA: Diagnosis not present

## 2016-06-11 DIAGNOSIS — I69920 Aphasia following unspecified cerebrovascular disease: Secondary | ICD-10-CM | POA: Diagnosis not present

## 2016-06-11 DIAGNOSIS — G309 Alzheimer's disease, unspecified: Secondary | ICD-10-CM | POA: Diagnosis not present

## 2016-06-11 DIAGNOSIS — I6990 Unspecified sequelae of unspecified cerebrovascular disease: Secondary | ICD-10-CM | POA: Diagnosis not present

## 2016-06-11 DIAGNOSIS — I69051 Hemiplegia and hemiparesis following nontraumatic subarachnoid hemorrhage affecting right dominant side: Secondary | ICD-10-CM | POA: Diagnosis not present

## 2016-06-11 DIAGNOSIS — K5901 Slow transit constipation: Secondary | ICD-10-CM | POA: Diagnosis not present

## 2016-06-13 DIAGNOSIS — R627 Adult failure to thrive: Secondary | ICD-10-CM | POA: Diagnosis not present

## 2016-06-13 DIAGNOSIS — I69920 Aphasia following unspecified cerebrovascular disease: Secondary | ICD-10-CM | POA: Diagnosis not present

## 2016-06-13 DIAGNOSIS — I6990 Unspecified sequelae of unspecified cerebrovascular disease: Secondary | ICD-10-CM | POA: Diagnosis not present

## 2016-06-13 DIAGNOSIS — I69051 Hemiplegia and hemiparesis following nontraumatic subarachnoid hemorrhage affecting right dominant side: Secondary | ICD-10-CM | POA: Diagnosis not present

## 2016-06-13 DIAGNOSIS — K5901 Slow transit constipation: Secondary | ICD-10-CM | POA: Diagnosis not present

## 2016-06-13 DIAGNOSIS — G309 Alzheimer's disease, unspecified: Secondary | ICD-10-CM | POA: Diagnosis not present

## 2016-06-16 ENCOUNTER — Other Ambulatory Visit: Payer: Self-pay | Admitting: *Deleted

## 2016-06-16 MED ORDER — MORPHINE SULFATE (CONCENTRATE) 20 MG/ML PO SOLN: 5.0000 mg | Freq: Every evening | ORAL | 0 refills | Status: AC | PRN

## 2016-06-16 NOTE — Telephone Encounter (Signed)
Patient daughter, Britta MccreedyBarbara requested and will pick up

## 2016-06-17 DIAGNOSIS — K5901 Slow transit constipation: Secondary | ICD-10-CM | POA: Diagnosis not present

## 2016-06-17 DIAGNOSIS — I6990 Unspecified sequelae of unspecified cerebrovascular disease: Secondary | ICD-10-CM | POA: Diagnosis not present

## 2016-06-17 DIAGNOSIS — I69920 Aphasia following unspecified cerebrovascular disease: Secondary | ICD-10-CM | POA: Diagnosis not present

## 2016-06-17 DIAGNOSIS — I69051 Hemiplegia and hemiparesis following nontraumatic subarachnoid hemorrhage affecting right dominant side: Secondary | ICD-10-CM | POA: Diagnosis not present

## 2016-06-17 DIAGNOSIS — R627 Adult failure to thrive: Secondary | ICD-10-CM | POA: Diagnosis not present

## 2016-06-17 DIAGNOSIS — G309 Alzheimer's disease, unspecified: Secondary | ICD-10-CM | POA: Diagnosis not present

## 2016-06-20 DIAGNOSIS — I69920 Aphasia following unspecified cerebrovascular disease: Secondary | ICD-10-CM | POA: Diagnosis not present

## 2016-06-20 DIAGNOSIS — I6990 Unspecified sequelae of unspecified cerebrovascular disease: Secondary | ICD-10-CM | POA: Diagnosis not present

## 2016-06-20 DIAGNOSIS — R627 Adult failure to thrive: Secondary | ICD-10-CM | POA: Diagnosis not present

## 2016-06-20 DIAGNOSIS — I69051 Hemiplegia and hemiparesis following nontraumatic subarachnoid hemorrhage affecting right dominant side: Secondary | ICD-10-CM | POA: Diagnosis not present

## 2016-06-20 DIAGNOSIS — K5901 Slow transit constipation: Secondary | ICD-10-CM | POA: Diagnosis not present

## 2016-06-20 DIAGNOSIS — G309 Alzheimer's disease, unspecified: Secondary | ICD-10-CM | POA: Diagnosis not present

## 2016-06-23 DIAGNOSIS — I69920 Aphasia following unspecified cerebrovascular disease: Secondary | ICD-10-CM | POA: Diagnosis not present

## 2016-06-23 DIAGNOSIS — I69051 Hemiplegia and hemiparesis following nontraumatic subarachnoid hemorrhage affecting right dominant side: Secondary | ICD-10-CM | POA: Diagnosis not present

## 2016-06-23 DIAGNOSIS — G309 Alzheimer's disease, unspecified: Secondary | ICD-10-CM | POA: Diagnosis not present

## 2016-06-23 DIAGNOSIS — R627 Adult failure to thrive: Secondary | ICD-10-CM | POA: Diagnosis not present

## 2016-06-23 DIAGNOSIS — I6990 Unspecified sequelae of unspecified cerebrovascular disease: Secondary | ICD-10-CM | POA: Diagnosis not present

## 2016-06-23 DIAGNOSIS — K5901 Slow transit constipation: Secondary | ICD-10-CM | POA: Diagnosis not present

## 2016-06-24 DIAGNOSIS — I69051 Hemiplegia and hemiparesis following nontraumatic subarachnoid hemorrhage affecting right dominant side: Secondary | ICD-10-CM | POA: Diagnosis not present

## 2016-06-24 DIAGNOSIS — I6990 Unspecified sequelae of unspecified cerebrovascular disease: Secondary | ICD-10-CM | POA: Diagnosis not present

## 2016-06-24 DIAGNOSIS — R627 Adult failure to thrive: Secondary | ICD-10-CM | POA: Diagnosis not present

## 2016-06-24 DIAGNOSIS — G309 Alzheimer's disease, unspecified: Secondary | ICD-10-CM | POA: Diagnosis not present

## 2016-06-24 DIAGNOSIS — I69920 Aphasia following unspecified cerebrovascular disease: Secondary | ICD-10-CM | POA: Diagnosis not present

## 2016-06-24 DIAGNOSIS — K5901 Slow transit constipation: Secondary | ICD-10-CM | POA: Diagnosis not present

## 2016-06-25 DIAGNOSIS — I69051 Hemiplegia and hemiparesis following nontraumatic subarachnoid hemorrhage affecting right dominant side: Secondary | ICD-10-CM | POA: Diagnosis not present

## 2016-06-25 DIAGNOSIS — I6990 Unspecified sequelae of unspecified cerebrovascular disease: Secondary | ICD-10-CM | POA: Diagnosis not present

## 2016-06-25 DIAGNOSIS — K5901 Slow transit constipation: Secondary | ICD-10-CM | POA: Diagnosis not present

## 2016-06-25 DIAGNOSIS — I69920 Aphasia following unspecified cerebrovascular disease: Secondary | ICD-10-CM | POA: Diagnosis not present

## 2016-06-25 DIAGNOSIS — R627 Adult failure to thrive: Secondary | ICD-10-CM | POA: Diagnosis not present

## 2016-06-25 DIAGNOSIS — G309 Alzheimer's disease, unspecified: Secondary | ICD-10-CM | POA: Diagnosis not present

## 2016-06-27 DIAGNOSIS — K5901 Slow transit constipation: Secondary | ICD-10-CM | POA: Diagnosis not present

## 2016-06-27 DIAGNOSIS — G309 Alzheimer's disease, unspecified: Secondary | ICD-10-CM | POA: Diagnosis not present

## 2016-06-27 DIAGNOSIS — I6990 Unspecified sequelae of unspecified cerebrovascular disease: Secondary | ICD-10-CM | POA: Diagnosis not present

## 2016-06-27 DIAGNOSIS — I69051 Hemiplegia and hemiparesis following nontraumatic subarachnoid hemorrhage affecting right dominant side: Secondary | ICD-10-CM | POA: Diagnosis not present

## 2016-06-27 DIAGNOSIS — R627 Adult failure to thrive: Secondary | ICD-10-CM | POA: Diagnosis not present

## 2016-06-27 DIAGNOSIS — I69920 Aphasia following unspecified cerebrovascular disease: Secondary | ICD-10-CM | POA: Diagnosis not present

## 2016-07-01 DIAGNOSIS — G309 Alzheimer's disease, unspecified: Secondary | ICD-10-CM | POA: Diagnosis not present

## 2016-07-01 DIAGNOSIS — R627 Adult failure to thrive: Secondary | ICD-10-CM | POA: Diagnosis not present

## 2016-07-01 DIAGNOSIS — I69051 Hemiplegia and hemiparesis following nontraumatic subarachnoid hemorrhage affecting right dominant side: Secondary | ICD-10-CM | POA: Diagnosis not present

## 2016-07-01 DIAGNOSIS — I69920 Aphasia following unspecified cerebrovascular disease: Secondary | ICD-10-CM | POA: Diagnosis not present

## 2016-07-01 DIAGNOSIS — I6990 Unspecified sequelae of unspecified cerebrovascular disease: Secondary | ICD-10-CM | POA: Diagnosis not present

## 2016-07-01 DIAGNOSIS — K5901 Slow transit constipation: Secondary | ICD-10-CM | POA: Diagnosis not present

## 2016-07-02 DIAGNOSIS — I6990 Unspecified sequelae of unspecified cerebrovascular disease: Secondary | ICD-10-CM | POA: Diagnosis not present

## 2016-07-02 DIAGNOSIS — G309 Alzheimer's disease, unspecified: Secondary | ICD-10-CM | POA: Diagnosis not present

## 2016-07-02 DIAGNOSIS — I69051 Hemiplegia and hemiparesis following nontraumatic subarachnoid hemorrhage affecting right dominant side: Secondary | ICD-10-CM | POA: Diagnosis not present

## 2016-07-02 DIAGNOSIS — K5901 Slow transit constipation: Secondary | ICD-10-CM | POA: Diagnosis not present

## 2016-07-02 DIAGNOSIS — I69920 Aphasia following unspecified cerebrovascular disease: Secondary | ICD-10-CM | POA: Diagnosis not present

## 2016-07-02 DIAGNOSIS — R627 Adult failure to thrive: Secondary | ICD-10-CM | POA: Diagnosis not present

## 2016-07-03 DIAGNOSIS — R627 Adult failure to thrive: Secondary | ICD-10-CM | POA: Diagnosis not present

## 2016-07-03 DIAGNOSIS — F0281 Dementia in other diseases classified elsewhere with behavioral disturbance: Secondary | ICD-10-CM | POA: Diagnosis not present

## 2016-07-03 DIAGNOSIS — K5901 Slow transit constipation: Secondary | ICD-10-CM | POA: Diagnosis not present

## 2016-07-03 DIAGNOSIS — F0151 Vascular dementia with behavioral disturbance: Secondary | ICD-10-CM | POA: Diagnosis not present

## 2016-07-03 DIAGNOSIS — I69051 Hemiplegia and hemiparesis following nontraumatic subarachnoid hemorrhage affecting right dominant side: Secondary | ICD-10-CM | POA: Diagnosis not present

## 2016-07-03 DIAGNOSIS — I69920 Aphasia following unspecified cerebrovascular disease: Secondary | ICD-10-CM | POA: Diagnosis not present

## 2016-07-03 DIAGNOSIS — G309 Alzheimer's disease, unspecified: Secondary | ICD-10-CM | POA: Diagnosis not present

## 2016-07-03 DIAGNOSIS — I6990 Unspecified sequelae of unspecified cerebrovascular disease: Secondary | ICD-10-CM | POA: Diagnosis not present

## 2016-07-04 DIAGNOSIS — I69920 Aphasia following unspecified cerebrovascular disease: Secondary | ICD-10-CM | POA: Diagnosis not present

## 2016-07-04 DIAGNOSIS — K5901 Slow transit constipation: Secondary | ICD-10-CM | POA: Diagnosis not present

## 2016-07-04 DIAGNOSIS — I69051 Hemiplegia and hemiparesis following nontraumatic subarachnoid hemorrhage affecting right dominant side: Secondary | ICD-10-CM | POA: Diagnosis not present

## 2016-07-04 DIAGNOSIS — R627 Adult failure to thrive: Secondary | ICD-10-CM | POA: Diagnosis not present

## 2016-07-04 DIAGNOSIS — I6990 Unspecified sequelae of unspecified cerebrovascular disease: Secondary | ICD-10-CM | POA: Diagnosis not present

## 2016-07-04 DIAGNOSIS — G309 Alzheimer's disease, unspecified: Secondary | ICD-10-CM | POA: Diagnosis not present

## 2016-07-07 DIAGNOSIS — G309 Alzheimer's disease, unspecified: Secondary | ICD-10-CM | POA: Diagnosis not present

## 2016-07-07 DIAGNOSIS — I6990 Unspecified sequelae of unspecified cerebrovascular disease: Secondary | ICD-10-CM | POA: Diagnosis not present

## 2016-07-07 DIAGNOSIS — R627 Adult failure to thrive: Secondary | ICD-10-CM | POA: Diagnosis not present

## 2016-07-07 DIAGNOSIS — I69051 Hemiplegia and hemiparesis following nontraumatic subarachnoid hemorrhage affecting right dominant side: Secondary | ICD-10-CM | POA: Diagnosis not present

## 2016-07-07 DIAGNOSIS — K5901 Slow transit constipation: Secondary | ICD-10-CM | POA: Diagnosis not present

## 2016-07-07 DIAGNOSIS — I69920 Aphasia following unspecified cerebrovascular disease: Secondary | ICD-10-CM | POA: Diagnosis not present

## 2016-07-08 DIAGNOSIS — G309 Alzheimer's disease, unspecified: Secondary | ICD-10-CM | POA: Diagnosis not present

## 2016-07-08 DIAGNOSIS — I69920 Aphasia following unspecified cerebrovascular disease: Secondary | ICD-10-CM | POA: Diagnosis not present

## 2016-07-08 DIAGNOSIS — I6990 Unspecified sequelae of unspecified cerebrovascular disease: Secondary | ICD-10-CM | POA: Diagnosis not present

## 2016-07-08 DIAGNOSIS — K5901 Slow transit constipation: Secondary | ICD-10-CM | POA: Diagnosis not present

## 2016-07-08 DIAGNOSIS — I69051 Hemiplegia and hemiparesis following nontraumatic subarachnoid hemorrhage affecting right dominant side: Secondary | ICD-10-CM | POA: Diagnosis not present

## 2016-07-08 DIAGNOSIS — R627 Adult failure to thrive: Secondary | ICD-10-CM | POA: Diagnosis not present

## 2016-07-11 DIAGNOSIS — K5901 Slow transit constipation: Secondary | ICD-10-CM | POA: Diagnosis not present

## 2016-07-11 DIAGNOSIS — I6990 Unspecified sequelae of unspecified cerebrovascular disease: Secondary | ICD-10-CM | POA: Diagnosis not present

## 2016-07-11 DIAGNOSIS — I69051 Hemiplegia and hemiparesis following nontraumatic subarachnoid hemorrhage affecting right dominant side: Secondary | ICD-10-CM | POA: Diagnosis not present

## 2016-07-11 DIAGNOSIS — G309 Alzheimer's disease, unspecified: Secondary | ICD-10-CM | POA: Diagnosis not present

## 2016-07-11 DIAGNOSIS — I69920 Aphasia following unspecified cerebrovascular disease: Secondary | ICD-10-CM | POA: Diagnosis not present

## 2016-07-11 DIAGNOSIS — R627 Adult failure to thrive: Secondary | ICD-10-CM | POA: Diagnosis not present

## 2016-07-14 DIAGNOSIS — I6990 Unspecified sequelae of unspecified cerebrovascular disease: Secondary | ICD-10-CM | POA: Diagnosis not present

## 2016-07-14 DIAGNOSIS — K5901 Slow transit constipation: Secondary | ICD-10-CM | POA: Diagnosis not present

## 2016-07-14 DIAGNOSIS — G309 Alzheimer's disease, unspecified: Secondary | ICD-10-CM | POA: Diagnosis not present

## 2016-07-14 DIAGNOSIS — R627 Adult failure to thrive: Secondary | ICD-10-CM | POA: Diagnosis not present

## 2016-07-14 DIAGNOSIS — I69051 Hemiplegia and hemiparesis following nontraumatic subarachnoid hemorrhage affecting right dominant side: Secondary | ICD-10-CM | POA: Diagnosis not present

## 2016-07-14 DIAGNOSIS — I69920 Aphasia following unspecified cerebrovascular disease: Secondary | ICD-10-CM | POA: Diagnosis not present

## 2016-07-15 DIAGNOSIS — I69051 Hemiplegia and hemiparesis following nontraumatic subarachnoid hemorrhage affecting right dominant side: Secondary | ICD-10-CM | POA: Diagnosis not present

## 2016-07-15 DIAGNOSIS — R627 Adult failure to thrive: Secondary | ICD-10-CM | POA: Diagnosis not present

## 2016-07-15 DIAGNOSIS — G309 Alzheimer's disease, unspecified: Secondary | ICD-10-CM | POA: Diagnosis not present

## 2016-07-15 DIAGNOSIS — K5901 Slow transit constipation: Secondary | ICD-10-CM | POA: Diagnosis not present

## 2016-07-15 DIAGNOSIS — I6990 Unspecified sequelae of unspecified cerebrovascular disease: Secondary | ICD-10-CM | POA: Diagnosis not present

## 2016-07-15 DIAGNOSIS — I69920 Aphasia following unspecified cerebrovascular disease: Secondary | ICD-10-CM | POA: Diagnosis not present

## 2016-07-18 DIAGNOSIS — I69051 Hemiplegia and hemiparesis following nontraumatic subarachnoid hemorrhage affecting right dominant side: Secondary | ICD-10-CM | POA: Diagnosis not present

## 2016-07-18 DIAGNOSIS — K5901 Slow transit constipation: Secondary | ICD-10-CM | POA: Diagnosis not present

## 2016-07-18 DIAGNOSIS — G309 Alzheimer's disease, unspecified: Secondary | ICD-10-CM | POA: Diagnosis not present

## 2016-07-18 DIAGNOSIS — I6990 Unspecified sequelae of unspecified cerebrovascular disease: Secondary | ICD-10-CM | POA: Diagnosis not present

## 2016-07-18 DIAGNOSIS — I69920 Aphasia following unspecified cerebrovascular disease: Secondary | ICD-10-CM | POA: Diagnosis not present

## 2016-07-18 DIAGNOSIS — R627 Adult failure to thrive: Secondary | ICD-10-CM | POA: Diagnosis not present

## 2016-07-22 DIAGNOSIS — I69920 Aphasia following unspecified cerebrovascular disease: Secondary | ICD-10-CM | POA: Diagnosis not present

## 2016-07-22 DIAGNOSIS — I69051 Hemiplegia and hemiparesis following nontraumatic subarachnoid hemorrhage affecting right dominant side: Secondary | ICD-10-CM | POA: Diagnosis not present

## 2016-07-22 DIAGNOSIS — I6990 Unspecified sequelae of unspecified cerebrovascular disease: Secondary | ICD-10-CM | POA: Diagnosis not present

## 2016-07-22 DIAGNOSIS — R627 Adult failure to thrive: Secondary | ICD-10-CM | POA: Diagnosis not present

## 2016-07-22 DIAGNOSIS — K5901 Slow transit constipation: Secondary | ICD-10-CM | POA: Diagnosis not present

## 2016-07-22 DIAGNOSIS — G309 Alzheimer's disease, unspecified: Secondary | ICD-10-CM | POA: Diagnosis not present

## 2016-07-23 ENCOUNTER — Other Ambulatory Visit: Payer: Self-pay | Admitting: Internal Medicine

## 2016-07-23 DIAGNOSIS — I6990 Unspecified sequelae of unspecified cerebrovascular disease: Secondary | ICD-10-CM | POA: Diagnosis not present

## 2016-07-23 DIAGNOSIS — K5901 Slow transit constipation: Secondary | ICD-10-CM | POA: Diagnosis not present

## 2016-07-23 DIAGNOSIS — G309 Alzheimer's disease, unspecified: Secondary | ICD-10-CM | POA: Diagnosis not present

## 2016-07-23 DIAGNOSIS — I69051 Hemiplegia and hemiparesis following nontraumatic subarachnoid hemorrhage affecting right dominant side: Secondary | ICD-10-CM | POA: Diagnosis not present

## 2016-07-23 DIAGNOSIS — I69920 Aphasia following unspecified cerebrovascular disease: Secondary | ICD-10-CM | POA: Diagnosis not present

## 2016-07-23 DIAGNOSIS — R627 Adult failure to thrive: Secondary | ICD-10-CM | POA: Diagnosis not present

## 2016-07-23 NOTE — Telephone Encounter (Signed)
Dr.Reed please advise if ok to fill or if patient needs an appointment prior to refill

## 2016-07-25 DIAGNOSIS — K5901 Slow transit constipation: Secondary | ICD-10-CM | POA: Diagnosis not present

## 2016-07-25 DIAGNOSIS — I69051 Hemiplegia and hemiparesis following nontraumatic subarachnoid hemorrhage affecting right dominant side: Secondary | ICD-10-CM | POA: Diagnosis not present

## 2016-07-25 DIAGNOSIS — I6990 Unspecified sequelae of unspecified cerebrovascular disease: Secondary | ICD-10-CM | POA: Diagnosis not present

## 2016-07-25 DIAGNOSIS — I69920 Aphasia following unspecified cerebrovascular disease: Secondary | ICD-10-CM | POA: Diagnosis not present

## 2016-07-25 DIAGNOSIS — G309 Alzheimer's disease, unspecified: Secondary | ICD-10-CM | POA: Diagnosis not present

## 2016-07-25 DIAGNOSIS — R627 Adult failure to thrive: Secondary | ICD-10-CM | POA: Diagnosis not present

## 2016-07-28 DIAGNOSIS — G309 Alzheimer's disease, unspecified: Secondary | ICD-10-CM | POA: Diagnosis not present

## 2016-07-28 DIAGNOSIS — K5901 Slow transit constipation: Secondary | ICD-10-CM | POA: Diagnosis not present

## 2016-07-28 DIAGNOSIS — I69920 Aphasia following unspecified cerebrovascular disease: Secondary | ICD-10-CM | POA: Diagnosis not present

## 2016-07-28 DIAGNOSIS — I6990 Unspecified sequelae of unspecified cerebrovascular disease: Secondary | ICD-10-CM | POA: Diagnosis not present

## 2016-07-28 DIAGNOSIS — I69051 Hemiplegia and hemiparesis following nontraumatic subarachnoid hemorrhage affecting right dominant side: Secondary | ICD-10-CM | POA: Diagnosis not present

## 2016-07-28 DIAGNOSIS — R627 Adult failure to thrive: Secondary | ICD-10-CM | POA: Diagnosis not present

## 2016-07-29 DIAGNOSIS — I69920 Aphasia following unspecified cerebrovascular disease: Secondary | ICD-10-CM | POA: Diagnosis not present

## 2016-07-29 DIAGNOSIS — K5901 Slow transit constipation: Secondary | ICD-10-CM | POA: Diagnosis not present

## 2016-07-29 DIAGNOSIS — G309 Alzheimer's disease, unspecified: Secondary | ICD-10-CM | POA: Diagnosis not present

## 2016-07-29 DIAGNOSIS — R627 Adult failure to thrive: Secondary | ICD-10-CM | POA: Diagnosis not present

## 2016-07-29 DIAGNOSIS — I69051 Hemiplegia and hemiparesis following nontraumatic subarachnoid hemorrhage affecting right dominant side: Secondary | ICD-10-CM | POA: Diagnosis not present

## 2016-07-29 DIAGNOSIS — I6990 Unspecified sequelae of unspecified cerebrovascular disease: Secondary | ICD-10-CM | POA: Diagnosis not present

## 2016-07-31 DIAGNOSIS — I69920 Aphasia following unspecified cerebrovascular disease: Secondary | ICD-10-CM | POA: Diagnosis not present

## 2016-07-31 DIAGNOSIS — R627 Adult failure to thrive: Secondary | ICD-10-CM | POA: Diagnosis not present

## 2016-07-31 DIAGNOSIS — F0281 Dementia in other diseases classified elsewhere with behavioral disturbance: Secondary | ICD-10-CM | POA: Diagnosis not present

## 2016-07-31 DIAGNOSIS — F0151 Vascular dementia with behavioral disturbance: Secondary | ICD-10-CM | POA: Diagnosis not present

## 2016-07-31 DIAGNOSIS — G309 Alzheimer's disease, unspecified: Secondary | ICD-10-CM | POA: Diagnosis not present

## 2016-07-31 DIAGNOSIS — I69051 Hemiplegia and hemiparesis following nontraumatic subarachnoid hemorrhage affecting right dominant side: Secondary | ICD-10-CM | POA: Diagnosis not present

## 2016-07-31 DIAGNOSIS — I6990 Unspecified sequelae of unspecified cerebrovascular disease: Secondary | ICD-10-CM | POA: Diagnosis not present

## 2016-07-31 DIAGNOSIS — K5901 Slow transit constipation: Secondary | ICD-10-CM | POA: Diagnosis not present

## 2016-08-01 DIAGNOSIS — I69920 Aphasia following unspecified cerebrovascular disease: Secondary | ICD-10-CM | POA: Diagnosis not present

## 2016-08-01 DIAGNOSIS — I6990 Unspecified sequelae of unspecified cerebrovascular disease: Secondary | ICD-10-CM | POA: Diagnosis not present

## 2016-08-01 DIAGNOSIS — K5901 Slow transit constipation: Secondary | ICD-10-CM | POA: Diagnosis not present

## 2016-08-01 DIAGNOSIS — I69051 Hemiplegia and hemiparesis following nontraumatic subarachnoid hemorrhage affecting right dominant side: Secondary | ICD-10-CM | POA: Diagnosis not present

## 2016-08-01 DIAGNOSIS — R627 Adult failure to thrive: Secondary | ICD-10-CM | POA: Diagnosis not present

## 2016-08-01 DIAGNOSIS — G309 Alzheimer's disease, unspecified: Secondary | ICD-10-CM | POA: Diagnosis not present

## 2016-08-04 ENCOUNTER — Other Ambulatory Visit: Payer: Self-pay | Admitting: Internal Medicine

## 2016-08-04 DIAGNOSIS — I69051 Hemiplegia and hemiparesis following nontraumatic subarachnoid hemorrhage affecting right dominant side: Secondary | ICD-10-CM | POA: Diagnosis not present

## 2016-08-04 DIAGNOSIS — K5901 Slow transit constipation: Secondary | ICD-10-CM | POA: Diagnosis not present

## 2016-08-04 DIAGNOSIS — I69920 Aphasia following unspecified cerebrovascular disease: Secondary | ICD-10-CM | POA: Diagnosis not present

## 2016-08-04 DIAGNOSIS — F01518 Vascular dementia, unspecified severity, with other behavioral disturbance: Secondary | ICD-10-CM

## 2016-08-04 DIAGNOSIS — R627 Adult failure to thrive: Secondary | ICD-10-CM | POA: Diagnosis not present

## 2016-08-04 DIAGNOSIS — I6990 Unspecified sequelae of unspecified cerebrovascular disease: Secondary | ICD-10-CM | POA: Diagnosis not present

## 2016-08-04 DIAGNOSIS — G309 Alzheimer's disease, unspecified: Secondary | ICD-10-CM | POA: Diagnosis not present

## 2016-08-04 DIAGNOSIS — F0151 Vascular dementia with behavioral disturbance: Secondary | ICD-10-CM

## 2016-08-05 DIAGNOSIS — R627 Adult failure to thrive: Secondary | ICD-10-CM | POA: Diagnosis not present

## 2016-08-05 DIAGNOSIS — I69051 Hemiplegia and hemiparesis following nontraumatic subarachnoid hemorrhage affecting right dominant side: Secondary | ICD-10-CM | POA: Diagnosis not present

## 2016-08-05 DIAGNOSIS — I69920 Aphasia following unspecified cerebrovascular disease: Secondary | ICD-10-CM | POA: Diagnosis not present

## 2016-08-05 DIAGNOSIS — K5901 Slow transit constipation: Secondary | ICD-10-CM | POA: Diagnosis not present

## 2016-08-05 DIAGNOSIS — I6990 Unspecified sequelae of unspecified cerebrovascular disease: Secondary | ICD-10-CM | POA: Diagnosis not present

## 2016-08-05 DIAGNOSIS — G309 Alzheimer's disease, unspecified: Secondary | ICD-10-CM | POA: Diagnosis not present

## 2016-08-06 DIAGNOSIS — K5901 Slow transit constipation: Secondary | ICD-10-CM | POA: Diagnosis not present

## 2016-08-06 DIAGNOSIS — I69920 Aphasia following unspecified cerebrovascular disease: Secondary | ICD-10-CM | POA: Diagnosis not present

## 2016-08-06 DIAGNOSIS — I69051 Hemiplegia and hemiparesis following nontraumatic subarachnoid hemorrhage affecting right dominant side: Secondary | ICD-10-CM | POA: Diagnosis not present

## 2016-08-06 DIAGNOSIS — I6990 Unspecified sequelae of unspecified cerebrovascular disease: Secondary | ICD-10-CM | POA: Diagnosis not present

## 2016-08-06 DIAGNOSIS — R627 Adult failure to thrive: Secondary | ICD-10-CM | POA: Diagnosis not present

## 2016-08-06 DIAGNOSIS — G309 Alzheimer's disease, unspecified: Secondary | ICD-10-CM | POA: Diagnosis not present

## 2016-08-08 DIAGNOSIS — R627 Adult failure to thrive: Secondary | ICD-10-CM | POA: Diagnosis not present

## 2016-08-08 DIAGNOSIS — G309 Alzheimer's disease, unspecified: Secondary | ICD-10-CM | POA: Diagnosis not present

## 2016-08-08 DIAGNOSIS — K5901 Slow transit constipation: Secondary | ICD-10-CM | POA: Diagnosis not present

## 2016-08-08 DIAGNOSIS — I69920 Aphasia following unspecified cerebrovascular disease: Secondary | ICD-10-CM | POA: Diagnosis not present

## 2016-08-08 DIAGNOSIS — I6990 Unspecified sequelae of unspecified cerebrovascular disease: Secondary | ICD-10-CM | POA: Diagnosis not present

## 2016-08-08 DIAGNOSIS — I69051 Hemiplegia and hemiparesis following nontraumatic subarachnoid hemorrhage affecting right dominant side: Secondary | ICD-10-CM | POA: Diagnosis not present

## 2016-08-12 DIAGNOSIS — R627 Adult failure to thrive: Secondary | ICD-10-CM | POA: Diagnosis not present

## 2016-08-12 DIAGNOSIS — I69920 Aphasia following unspecified cerebrovascular disease: Secondary | ICD-10-CM | POA: Diagnosis not present

## 2016-08-12 DIAGNOSIS — G309 Alzheimer's disease, unspecified: Secondary | ICD-10-CM | POA: Diagnosis not present

## 2016-08-12 DIAGNOSIS — I6990 Unspecified sequelae of unspecified cerebrovascular disease: Secondary | ICD-10-CM | POA: Diagnosis not present

## 2016-08-12 DIAGNOSIS — I69051 Hemiplegia and hemiparesis following nontraumatic subarachnoid hemorrhage affecting right dominant side: Secondary | ICD-10-CM | POA: Diagnosis not present

## 2016-08-12 DIAGNOSIS — K5901 Slow transit constipation: Secondary | ICD-10-CM | POA: Diagnosis not present

## 2016-08-15 DIAGNOSIS — I69051 Hemiplegia and hemiparesis following nontraumatic subarachnoid hemorrhage affecting right dominant side: Secondary | ICD-10-CM | POA: Diagnosis not present

## 2016-08-15 DIAGNOSIS — I69920 Aphasia following unspecified cerebrovascular disease: Secondary | ICD-10-CM | POA: Diagnosis not present

## 2016-08-15 DIAGNOSIS — K5901 Slow transit constipation: Secondary | ICD-10-CM | POA: Diagnosis not present

## 2016-08-15 DIAGNOSIS — I6990 Unspecified sequelae of unspecified cerebrovascular disease: Secondary | ICD-10-CM | POA: Diagnosis not present

## 2016-08-15 DIAGNOSIS — G309 Alzheimer's disease, unspecified: Secondary | ICD-10-CM | POA: Diagnosis not present

## 2016-08-15 DIAGNOSIS — R627 Adult failure to thrive: Secondary | ICD-10-CM | POA: Diagnosis not present

## 2016-08-18 DIAGNOSIS — G309 Alzheimer's disease, unspecified: Secondary | ICD-10-CM | POA: Diagnosis not present

## 2016-08-18 DIAGNOSIS — R627 Adult failure to thrive: Secondary | ICD-10-CM | POA: Diagnosis not present

## 2016-08-18 DIAGNOSIS — I69051 Hemiplegia and hemiparesis following nontraumatic subarachnoid hemorrhage affecting right dominant side: Secondary | ICD-10-CM | POA: Diagnosis not present

## 2016-08-18 DIAGNOSIS — I69920 Aphasia following unspecified cerebrovascular disease: Secondary | ICD-10-CM | POA: Diagnosis not present

## 2016-08-18 DIAGNOSIS — K5901 Slow transit constipation: Secondary | ICD-10-CM | POA: Diagnosis not present

## 2016-08-18 DIAGNOSIS — I6990 Unspecified sequelae of unspecified cerebrovascular disease: Secondary | ICD-10-CM | POA: Diagnosis not present

## 2016-08-19 DIAGNOSIS — I69920 Aphasia following unspecified cerebrovascular disease: Secondary | ICD-10-CM | POA: Diagnosis not present

## 2016-08-19 DIAGNOSIS — I6990 Unspecified sequelae of unspecified cerebrovascular disease: Secondary | ICD-10-CM | POA: Diagnosis not present

## 2016-08-19 DIAGNOSIS — K5901 Slow transit constipation: Secondary | ICD-10-CM | POA: Diagnosis not present

## 2016-08-19 DIAGNOSIS — I69051 Hemiplegia and hemiparesis following nontraumatic subarachnoid hemorrhage affecting right dominant side: Secondary | ICD-10-CM | POA: Diagnosis not present

## 2016-08-19 DIAGNOSIS — G309 Alzheimer's disease, unspecified: Secondary | ICD-10-CM | POA: Diagnosis not present

## 2016-08-19 DIAGNOSIS — R627 Adult failure to thrive: Secondary | ICD-10-CM | POA: Diagnosis not present

## 2016-08-21 DIAGNOSIS — I69920 Aphasia following unspecified cerebrovascular disease: Secondary | ICD-10-CM | POA: Diagnosis not present

## 2016-08-21 DIAGNOSIS — I6990 Unspecified sequelae of unspecified cerebrovascular disease: Secondary | ICD-10-CM | POA: Diagnosis not present

## 2016-08-21 DIAGNOSIS — I69051 Hemiplegia and hemiparesis following nontraumatic subarachnoid hemorrhage affecting right dominant side: Secondary | ICD-10-CM | POA: Diagnosis not present

## 2016-08-21 DIAGNOSIS — G309 Alzheimer's disease, unspecified: Secondary | ICD-10-CM | POA: Diagnosis not present

## 2016-08-21 DIAGNOSIS — R627 Adult failure to thrive: Secondary | ICD-10-CM | POA: Diagnosis not present

## 2016-08-21 DIAGNOSIS — K5901 Slow transit constipation: Secondary | ICD-10-CM | POA: Diagnosis not present

## 2016-08-22 DIAGNOSIS — I69920 Aphasia following unspecified cerebrovascular disease: Secondary | ICD-10-CM | POA: Diagnosis not present

## 2016-08-22 DIAGNOSIS — K5901 Slow transit constipation: Secondary | ICD-10-CM | POA: Diagnosis not present

## 2016-08-22 DIAGNOSIS — I69051 Hemiplegia and hemiparesis following nontraumatic subarachnoid hemorrhage affecting right dominant side: Secondary | ICD-10-CM | POA: Diagnosis not present

## 2016-08-22 DIAGNOSIS — I6990 Unspecified sequelae of unspecified cerebrovascular disease: Secondary | ICD-10-CM | POA: Diagnosis not present

## 2016-08-22 DIAGNOSIS — R627 Adult failure to thrive: Secondary | ICD-10-CM | POA: Diagnosis not present

## 2016-08-22 DIAGNOSIS — G309 Alzheimer's disease, unspecified: Secondary | ICD-10-CM | POA: Diagnosis not present

## 2016-08-25 DIAGNOSIS — I69051 Hemiplegia and hemiparesis following nontraumatic subarachnoid hemorrhage affecting right dominant side: Secondary | ICD-10-CM | POA: Diagnosis not present

## 2016-08-25 DIAGNOSIS — I6990 Unspecified sequelae of unspecified cerebrovascular disease: Secondary | ICD-10-CM | POA: Diagnosis not present

## 2016-08-25 DIAGNOSIS — I69920 Aphasia following unspecified cerebrovascular disease: Secondary | ICD-10-CM | POA: Diagnosis not present

## 2016-08-25 DIAGNOSIS — K5901 Slow transit constipation: Secondary | ICD-10-CM | POA: Diagnosis not present

## 2016-08-25 DIAGNOSIS — G309 Alzheimer's disease, unspecified: Secondary | ICD-10-CM | POA: Diagnosis not present

## 2016-08-25 DIAGNOSIS — R627 Adult failure to thrive: Secondary | ICD-10-CM | POA: Diagnosis not present

## 2016-08-26 DIAGNOSIS — K5901 Slow transit constipation: Secondary | ICD-10-CM | POA: Diagnosis not present

## 2016-08-26 DIAGNOSIS — G309 Alzheimer's disease, unspecified: Secondary | ICD-10-CM | POA: Diagnosis not present

## 2016-08-26 DIAGNOSIS — I69920 Aphasia following unspecified cerebrovascular disease: Secondary | ICD-10-CM | POA: Diagnosis not present

## 2016-08-26 DIAGNOSIS — I69051 Hemiplegia and hemiparesis following nontraumatic subarachnoid hemorrhage affecting right dominant side: Secondary | ICD-10-CM | POA: Diagnosis not present

## 2016-08-26 DIAGNOSIS — I6990 Unspecified sequelae of unspecified cerebrovascular disease: Secondary | ICD-10-CM | POA: Diagnosis not present

## 2016-08-26 DIAGNOSIS — R627 Adult failure to thrive: Secondary | ICD-10-CM | POA: Diagnosis not present

## 2016-08-29 DIAGNOSIS — G309 Alzheimer's disease, unspecified: Secondary | ICD-10-CM | POA: Diagnosis not present

## 2016-08-29 DIAGNOSIS — R627 Adult failure to thrive: Secondary | ICD-10-CM | POA: Diagnosis not present

## 2016-08-29 DIAGNOSIS — I6990 Unspecified sequelae of unspecified cerebrovascular disease: Secondary | ICD-10-CM | POA: Diagnosis not present

## 2016-08-29 DIAGNOSIS — I69051 Hemiplegia and hemiparesis following nontraumatic subarachnoid hemorrhage affecting right dominant side: Secondary | ICD-10-CM | POA: Diagnosis not present

## 2016-08-29 DIAGNOSIS — K5901 Slow transit constipation: Secondary | ICD-10-CM | POA: Diagnosis not present

## 2016-08-29 DIAGNOSIS — I69920 Aphasia following unspecified cerebrovascular disease: Secondary | ICD-10-CM | POA: Diagnosis not present

## 2016-08-31 DIAGNOSIS — I6990 Unspecified sequelae of unspecified cerebrovascular disease: Secondary | ICD-10-CM | POA: Diagnosis not present

## 2016-08-31 DIAGNOSIS — I69051 Hemiplegia and hemiparesis following nontraumatic subarachnoid hemorrhage affecting right dominant side: Secondary | ICD-10-CM | POA: Diagnosis not present

## 2016-08-31 DIAGNOSIS — F0151 Vascular dementia with behavioral disturbance: Secondary | ICD-10-CM | POA: Diagnosis not present

## 2016-08-31 DIAGNOSIS — K5901 Slow transit constipation: Secondary | ICD-10-CM | POA: Diagnosis not present

## 2016-08-31 DIAGNOSIS — R627 Adult failure to thrive: Secondary | ICD-10-CM | POA: Diagnosis not present

## 2016-08-31 DIAGNOSIS — G309 Alzheimer's disease, unspecified: Secondary | ICD-10-CM | POA: Diagnosis not present

## 2016-08-31 DIAGNOSIS — F0281 Dementia in other diseases classified elsewhere with behavioral disturbance: Secondary | ICD-10-CM | POA: Diagnosis not present

## 2016-08-31 DIAGNOSIS — I69920 Aphasia following unspecified cerebrovascular disease: Secondary | ICD-10-CM | POA: Diagnosis not present

## 2016-09-02 DIAGNOSIS — R627 Adult failure to thrive: Secondary | ICD-10-CM | POA: Diagnosis not present

## 2016-09-02 DIAGNOSIS — I6990 Unspecified sequelae of unspecified cerebrovascular disease: Secondary | ICD-10-CM | POA: Diagnosis not present

## 2016-09-02 DIAGNOSIS — G309 Alzheimer's disease, unspecified: Secondary | ICD-10-CM | POA: Diagnosis not present

## 2016-09-02 DIAGNOSIS — K5901 Slow transit constipation: Secondary | ICD-10-CM | POA: Diagnosis not present

## 2016-09-02 DIAGNOSIS — I69920 Aphasia following unspecified cerebrovascular disease: Secondary | ICD-10-CM | POA: Diagnosis not present

## 2016-09-02 DIAGNOSIS — I69051 Hemiplegia and hemiparesis following nontraumatic subarachnoid hemorrhage affecting right dominant side: Secondary | ICD-10-CM | POA: Diagnosis not present

## 2016-09-03 ENCOUNTER — Other Ambulatory Visit: Payer: Self-pay | Admitting: Internal Medicine

## 2016-09-03 DIAGNOSIS — G309 Alzheimer's disease, unspecified: Secondary | ICD-10-CM | POA: Diagnosis not present

## 2016-09-03 DIAGNOSIS — R627 Adult failure to thrive: Secondary | ICD-10-CM | POA: Diagnosis not present

## 2016-09-03 DIAGNOSIS — I69920 Aphasia following unspecified cerebrovascular disease: Secondary | ICD-10-CM | POA: Diagnosis not present

## 2016-09-03 DIAGNOSIS — K5901 Slow transit constipation: Secondary | ICD-10-CM | POA: Diagnosis not present

## 2016-09-03 DIAGNOSIS — I6990 Unspecified sequelae of unspecified cerebrovascular disease: Secondary | ICD-10-CM | POA: Diagnosis not present

## 2016-09-03 DIAGNOSIS — I69051 Hemiplegia and hemiparesis following nontraumatic subarachnoid hemorrhage affecting right dominant side: Secondary | ICD-10-CM | POA: Diagnosis not present

## 2016-09-04 DIAGNOSIS — K5901 Slow transit constipation: Secondary | ICD-10-CM | POA: Diagnosis not present

## 2016-09-04 DIAGNOSIS — I69051 Hemiplegia and hemiparesis following nontraumatic subarachnoid hemorrhage affecting right dominant side: Secondary | ICD-10-CM | POA: Diagnosis not present

## 2016-09-04 DIAGNOSIS — I6990 Unspecified sequelae of unspecified cerebrovascular disease: Secondary | ICD-10-CM | POA: Diagnosis not present

## 2016-09-04 DIAGNOSIS — R627 Adult failure to thrive: Secondary | ICD-10-CM | POA: Diagnosis not present

## 2016-09-04 DIAGNOSIS — G309 Alzheimer's disease, unspecified: Secondary | ICD-10-CM | POA: Diagnosis not present

## 2016-09-04 DIAGNOSIS — I69920 Aphasia following unspecified cerebrovascular disease: Secondary | ICD-10-CM | POA: Diagnosis not present

## 2016-09-05 DIAGNOSIS — I69920 Aphasia following unspecified cerebrovascular disease: Secondary | ICD-10-CM | POA: Diagnosis not present

## 2016-09-05 DIAGNOSIS — I6990 Unspecified sequelae of unspecified cerebrovascular disease: Secondary | ICD-10-CM | POA: Diagnosis not present

## 2016-09-05 DIAGNOSIS — G309 Alzheimer's disease, unspecified: Secondary | ICD-10-CM | POA: Diagnosis not present

## 2016-09-05 DIAGNOSIS — I69051 Hemiplegia and hemiparesis following nontraumatic subarachnoid hemorrhage affecting right dominant side: Secondary | ICD-10-CM | POA: Diagnosis not present

## 2016-09-05 DIAGNOSIS — R627 Adult failure to thrive: Secondary | ICD-10-CM | POA: Diagnosis not present

## 2016-09-05 DIAGNOSIS — K5901 Slow transit constipation: Secondary | ICD-10-CM | POA: Diagnosis not present

## 2016-09-08 DIAGNOSIS — I69920 Aphasia following unspecified cerebrovascular disease: Secondary | ICD-10-CM | POA: Diagnosis not present

## 2016-09-08 DIAGNOSIS — I69051 Hemiplegia and hemiparesis following nontraumatic subarachnoid hemorrhage affecting right dominant side: Secondary | ICD-10-CM | POA: Diagnosis not present

## 2016-09-08 DIAGNOSIS — K5901 Slow transit constipation: Secondary | ICD-10-CM | POA: Diagnosis not present

## 2016-09-08 DIAGNOSIS — R627 Adult failure to thrive: Secondary | ICD-10-CM | POA: Diagnosis not present

## 2016-09-08 DIAGNOSIS — I6990 Unspecified sequelae of unspecified cerebrovascular disease: Secondary | ICD-10-CM | POA: Diagnosis not present

## 2016-09-08 DIAGNOSIS — G309 Alzheimer's disease, unspecified: Secondary | ICD-10-CM | POA: Diagnosis not present

## 2016-09-09 DIAGNOSIS — I69920 Aphasia following unspecified cerebrovascular disease: Secondary | ICD-10-CM | POA: Diagnosis not present

## 2016-09-09 DIAGNOSIS — G309 Alzheimer's disease, unspecified: Secondary | ICD-10-CM | POA: Diagnosis not present

## 2016-09-09 DIAGNOSIS — R627 Adult failure to thrive: Secondary | ICD-10-CM | POA: Diagnosis not present

## 2016-09-09 DIAGNOSIS — K5901 Slow transit constipation: Secondary | ICD-10-CM | POA: Diagnosis not present

## 2016-09-09 DIAGNOSIS — I69051 Hemiplegia and hemiparesis following nontraumatic subarachnoid hemorrhage affecting right dominant side: Secondary | ICD-10-CM | POA: Diagnosis not present

## 2016-09-09 DIAGNOSIS — I6990 Unspecified sequelae of unspecified cerebrovascular disease: Secondary | ICD-10-CM | POA: Diagnosis not present

## 2016-09-12 DIAGNOSIS — I69051 Hemiplegia and hemiparesis following nontraumatic subarachnoid hemorrhage affecting right dominant side: Secondary | ICD-10-CM | POA: Diagnosis not present

## 2016-09-12 DIAGNOSIS — I6990 Unspecified sequelae of unspecified cerebrovascular disease: Secondary | ICD-10-CM | POA: Diagnosis not present

## 2016-09-12 DIAGNOSIS — G309 Alzheimer's disease, unspecified: Secondary | ICD-10-CM | POA: Diagnosis not present

## 2016-09-12 DIAGNOSIS — R627 Adult failure to thrive: Secondary | ICD-10-CM | POA: Diagnosis not present

## 2016-09-12 DIAGNOSIS — K5901 Slow transit constipation: Secondary | ICD-10-CM | POA: Diagnosis not present

## 2016-09-12 DIAGNOSIS — I69920 Aphasia following unspecified cerebrovascular disease: Secondary | ICD-10-CM | POA: Diagnosis not present

## 2016-09-16 DIAGNOSIS — R627 Adult failure to thrive: Secondary | ICD-10-CM | POA: Diagnosis not present

## 2016-09-16 DIAGNOSIS — G309 Alzheimer's disease, unspecified: Secondary | ICD-10-CM | POA: Diagnosis not present

## 2016-09-16 DIAGNOSIS — I69051 Hemiplegia and hemiparesis following nontraumatic subarachnoid hemorrhage affecting right dominant side: Secondary | ICD-10-CM | POA: Diagnosis not present

## 2016-09-16 DIAGNOSIS — I6990 Unspecified sequelae of unspecified cerebrovascular disease: Secondary | ICD-10-CM | POA: Diagnosis not present

## 2016-09-16 DIAGNOSIS — I69920 Aphasia following unspecified cerebrovascular disease: Secondary | ICD-10-CM | POA: Diagnosis not present

## 2016-09-16 DIAGNOSIS — K5901 Slow transit constipation: Secondary | ICD-10-CM | POA: Diagnosis not present

## 2016-09-19 DIAGNOSIS — G309 Alzheimer's disease, unspecified: Secondary | ICD-10-CM | POA: Diagnosis not present

## 2016-09-19 DIAGNOSIS — I69051 Hemiplegia and hemiparesis following nontraumatic subarachnoid hemorrhage affecting right dominant side: Secondary | ICD-10-CM | POA: Diagnosis not present

## 2016-09-19 DIAGNOSIS — K5901 Slow transit constipation: Secondary | ICD-10-CM | POA: Diagnosis not present

## 2016-09-19 DIAGNOSIS — I69920 Aphasia following unspecified cerebrovascular disease: Secondary | ICD-10-CM | POA: Diagnosis not present

## 2016-09-19 DIAGNOSIS — I6990 Unspecified sequelae of unspecified cerebrovascular disease: Secondary | ICD-10-CM | POA: Diagnosis not present

## 2016-09-19 DIAGNOSIS — R627 Adult failure to thrive: Secondary | ICD-10-CM | POA: Diagnosis not present

## 2016-09-22 DIAGNOSIS — R627 Adult failure to thrive: Secondary | ICD-10-CM | POA: Diagnosis not present

## 2016-09-22 DIAGNOSIS — I69920 Aphasia following unspecified cerebrovascular disease: Secondary | ICD-10-CM | POA: Diagnosis not present

## 2016-09-22 DIAGNOSIS — K5901 Slow transit constipation: Secondary | ICD-10-CM | POA: Diagnosis not present

## 2016-09-22 DIAGNOSIS — I6990 Unspecified sequelae of unspecified cerebrovascular disease: Secondary | ICD-10-CM | POA: Diagnosis not present

## 2016-09-22 DIAGNOSIS — I69051 Hemiplegia and hemiparesis following nontraumatic subarachnoid hemorrhage affecting right dominant side: Secondary | ICD-10-CM | POA: Diagnosis not present

## 2016-09-22 DIAGNOSIS — G309 Alzheimer's disease, unspecified: Secondary | ICD-10-CM | POA: Diagnosis not present

## 2016-09-23 DIAGNOSIS — I69920 Aphasia following unspecified cerebrovascular disease: Secondary | ICD-10-CM | POA: Diagnosis not present

## 2016-09-23 DIAGNOSIS — K5901 Slow transit constipation: Secondary | ICD-10-CM | POA: Diagnosis not present

## 2016-09-23 DIAGNOSIS — R627 Adult failure to thrive: Secondary | ICD-10-CM | POA: Diagnosis not present

## 2016-09-23 DIAGNOSIS — I6990 Unspecified sequelae of unspecified cerebrovascular disease: Secondary | ICD-10-CM | POA: Diagnosis not present

## 2016-09-23 DIAGNOSIS — I69051 Hemiplegia and hemiparesis following nontraumatic subarachnoid hemorrhage affecting right dominant side: Secondary | ICD-10-CM | POA: Diagnosis not present

## 2016-09-23 DIAGNOSIS — G309 Alzheimer's disease, unspecified: Secondary | ICD-10-CM | POA: Diagnosis not present

## 2016-09-26 DIAGNOSIS — I6990 Unspecified sequelae of unspecified cerebrovascular disease: Secondary | ICD-10-CM | POA: Diagnosis not present

## 2016-09-26 DIAGNOSIS — I69051 Hemiplegia and hemiparesis following nontraumatic subarachnoid hemorrhage affecting right dominant side: Secondary | ICD-10-CM | POA: Diagnosis not present

## 2016-09-26 DIAGNOSIS — R627 Adult failure to thrive: Secondary | ICD-10-CM | POA: Diagnosis not present

## 2016-09-26 DIAGNOSIS — I69920 Aphasia following unspecified cerebrovascular disease: Secondary | ICD-10-CM | POA: Diagnosis not present

## 2016-09-26 DIAGNOSIS — G309 Alzheimer's disease, unspecified: Secondary | ICD-10-CM | POA: Diagnosis not present

## 2016-09-26 DIAGNOSIS — K5901 Slow transit constipation: Secondary | ICD-10-CM | POA: Diagnosis not present

## 2016-09-30 DIAGNOSIS — I6990 Unspecified sequelae of unspecified cerebrovascular disease: Secondary | ICD-10-CM | POA: Diagnosis not present

## 2016-09-30 DIAGNOSIS — I69051 Hemiplegia and hemiparesis following nontraumatic subarachnoid hemorrhage affecting right dominant side: Secondary | ICD-10-CM | POA: Diagnosis not present

## 2016-09-30 DIAGNOSIS — R627 Adult failure to thrive: Secondary | ICD-10-CM | POA: Diagnosis not present

## 2016-09-30 DIAGNOSIS — K5901 Slow transit constipation: Secondary | ICD-10-CM | POA: Diagnosis not present

## 2016-09-30 DIAGNOSIS — F0281 Dementia in other diseases classified elsewhere with behavioral disturbance: Secondary | ICD-10-CM | POA: Diagnosis not present

## 2016-09-30 DIAGNOSIS — G309 Alzheimer's disease, unspecified: Secondary | ICD-10-CM | POA: Diagnosis not present

## 2016-09-30 DIAGNOSIS — F0151 Vascular dementia with behavioral disturbance: Secondary | ICD-10-CM | POA: Diagnosis not present

## 2016-09-30 DIAGNOSIS — I69920 Aphasia following unspecified cerebrovascular disease: Secondary | ICD-10-CM | POA: Diagnosis not present

## 2016-10-01 DIAGNOSIS — I69051 Hemiplegia and hemiparesis following nontraumatic subarachnoid hemorrhage affecting right dominant side: Secondary | ICD-10-CM | POA: Diagnosis not present

## 2016-10-01 DIAGNOSIS — G309 Alzheimer's disease, unspecified: Secondary | ICD-10-CM | POA: Diagnosis not present

## 2016-10-01 DIAGNOSIS — I69920 Aphasia following unspecified cerebrovascular disease: Secondary | ICD-10-CM | POA: Diagnosis not present

## 2016-10-01 DIAGNOSIS — I6990 Unspecified sequelae of unspecified cerebrovascular disease: Secondary | ICD-10-CM | POA: Diagnosis not present

## 2016-10-01 DIAGNOSIS — K5901 Slow transit constipation: Secondary | ICD-10-CM | POA: Diagnosis not present

## 2016-10-01 DIAGNOSIS — R627 Adult failure to thrive: Secondary | ICD-10-CM | POA: Diagnosis not present

## 2016-10-02 DIAGNOSIS — I69920 Aphasia following unspecified cerebrovascular disease: Secondary | ICD-10-CM | POA: Diagnosis not present

## 2016-10-02 DIAGNOSIS — R627 Adult failure to thrive: Secondary | ICD-10-CM | POA: Diagnosis not present

## 2016-10-02 DIAGNOSIS — G309 Alzheimer's disease, unspecified: Secondary | ICD-10-CM | POA: Diagnosis not present

## 2016-10-02 DIAGNOSIS — K5901 Slow transit constipation: Secondary | ICD-10-CM | POA: Diagnosis not present

## 2016-10-02 DIAGNOSIS — I6990 Unspecified sequelae of unspecified cerebrovascular disease: Secondary | ICD-10-CM | POA: Diagnosis not present

## 2016-10-02 DIAGNOSIS — I69051 Hemiplegia and hemiparesis following nontraumatic subarachnoid hemorrhage affecting right dominant side: Secondary | ICD-10-CM | POA: Diagnosis not present

## 2016-10-03 DIAGNOSIS — I6990 Unspecified sequelae of unspecified cerebrovascular disease: Secondary | ICD-10-CM | POA: Diagnosis not present

## 2016-10-03 DIAGNOSIS — R627 Adult failure to thrive: Secondary | ICD-10-CM | POA: Diagnosis not present

## 2016-10-03 DIAGNOSIS — I69051 Hemiplegia and hemiparesis following nontraumatic subarachnoid hemorrhage affecting right dominant side: Secondary | ICD-10-CM | POA: Diagnosis not present

## 2016-10-03 DIAGNOSIS — I69920 Aphasia following unspecified cerebrovascular disease: Secondary | ICD-10-CM | POA: Diagnosis not present

## 2016-10-03 DIAGNOSIS — G309 Alzheimer's disease, unspecified: Secondary | ICD-10-CM | POA: Diagnosis not present

## 2016-10-03 DIAGNOSIS — K5901 Slow transit constipation: Secondary | ICD-10-CM | POA: Diagnosis not present

## 2016-10-06 DIAGNOSIS — R627 Adult failure to thrive: Secondary | ICD-10-CM | POA: Diagnosis not present

## 2016-10-06 DIAGNOSIS — K5901 Slow transit constipation: Secondary | ICD-10-CM | POA: Diagnosis not present

## 2016-10-06 DIAGNOSIS — G309 Alzheimer's disease, unspecified: Secondary | ICD-10-CM | POA: Diagnosis not present

## 2016-10-06 DIAGNOSIS — I69051 Hemiplegia and hemiparesis following nontraumatic subarachnoid hemorrhage affecting right dominant side: Secondary | ICD-10-CM | POA: Diagnosis not present

## 2016-10-06 DIAGNOSIS — I69920 Aphasia following unspecified cerebrovascular disease: Secondary | ICD-10-CM | POA: Diagnosis not present

## 2016-10-06 DIAGNOSIS — I6990 Unspecified sequelae of unspecified cerebrovascular disease: Secondary | ICD-10-CM | POA: Diagnosis not present

## 2016-10-07 DIAGNOSIS — K5901 Slow transit constipation: Secondary | ICD-10-CM | POA: Diagnosis not present

## 2016-10-07 DIAGNOSIS — I6990 Unspecified sequelae of unspecified cerebrovascular disease: Secondary | ICD-10-CM | POA: Diagnosis not present

## 2016-10-07 DIAGNOSIS — I69920 Aphasia following unspecified cerebrovascular disease: Secondary | ICD-10-CM | POA: Diagnosis not present

## 2016-10-07 DIAGNOSIS — G309 Alzheimer's disease, unspecified: Secondary | ICD-10-CM | POA: Diagnosis not present

## 2016-10-07 DIAGNOSIS — I69051 Hemiplegia and hemiparesis following nontraumatic subarachnoid hemorrhage affecting right dominant side: Secondary | ICD-10-CM | POA: Diagnosis not present

## 2016-10-07 DIAGNOSIS — R627 Adult failure to thrive: Secondary | ICD-10-CM | POA: Diagnosis not present

## 2016-10-08 DIAGNOSIS — I69920 Aphasia following unspecified cerebrovascular disease: Secondary | ICD-10-CM | POA: Diagnosis not present

## 2016-10-08 DIAGNOSIS — G309 Alzheimer's disease, unspecified: Secondary | ICD-10-CM | POA: Diagnosis not present

## 2016-10-08 DIAGNOSIS — R627 Adult failure to thrive: Secondary | ICD-10-CM | POA: Diagnosis not present

## 2016-10-08 DIAGNOSIS — I69051 Hemiplegia and hemiparesis following nontraumatic subarachnoid hemorrhage affecting right dominant side: Secondary | ICD-10-CM | POA: Diagnosis not present

## 2016-10-08 DIAGNOSIS — I6990 Unspecified sequelae of unspecified cerebrovascular disease: Secondary | ICD-10-CM | POA: Diagnosis not present

## 2016-10-08 DIAGNOSIS — K5901 Slow transit constipation: Secondary | ICD-10-CM | POA: Diagnosis not present

## 2016-10-09 DIAGNOSIS — I6990 Unspecified sequelae of unspecified cerebrovascular disease: Secondary | ICD-10-CM | POA: Diagnosis not present

## 2016-10-09 DIAGNOSIS — K5901 Slow transit constipation: Secondary | ICD-10-CM | POA: Diagnosis not present

## 2016-10-09 DIAGNOSIS — R627 Adult failure to thrive: Secondary | ICD-10-CM | POA: Diagnosis not present

## 2016-10-09 DIAGNOSIS — I69051 Hemiplegia and hemiparesis following nontraumatic subarachnoid hemorrhage affecting right dominant side: Secondary | ICD-10-CM | POA: Diagnosis not present

## 2016-10-09 DIAGNOSIS — I69920 Aphasia following unspecified cerebrovascular disease: Secondary | ICD-10-CM | POA: Diagnosis not present

## 2016-10-09 DIAGNOSIS — G309 Alzheimer's disease, unspecified: Secondary | ICD-10-CM | POA: Diagnosis not present

## 2016-10-10 DIAGNOSIS — I69051 Hemiplegia and hemiparesis following nontraumatic subarachnoid hemorrhage affecting right dominant side: Secondary | ICD-10-CM | POA: Diagnosis not present

## 2016-10-10 DIAGNOSIS — R627 Adult failure to thrive: Secondary | ICD-10-CM | POA: Diagnosis not present

## 2016-10-10 DIAGNOSIS — K5901 Slow transit constipation: Secondary | ICD-10-CM | POA: Diagnosis not present

## 2016-10-10 DIAGNOSIS — I6990 Unspecified sequelae of unspecified cerebrovascular disease: Secondary | ICD-10-CM | POA: Diagnosis not present

## 2016-10-10 DIAGNOSIS — G309 Alzheimer's disease, unspecified: Secondary | ICD-10-CM | POA: Diagnosis not present

## 2016-10-10 DIAGNOSIS — I69920 Aphasia following unspecified cerebrovascular disease: Secondary | ICD-10-CM | POA: Diagnosis not present

## 2016-10-14 DIAGNOSIS — R627 Adult failure to thrive: Secondary | ICD-10-CM | POA: Diagnosis not present

## 2016-10-14 DIAGNOSIS — I69920 Aphasia following unspecified cerebrovascular disease: Secondary | ICD-10-CM | POA: Diagnosis not present

## 2016-10-14 DIAGNOSIS — I6990 Unspecified sequelae of unspecified cerebrovascular disease: Secondary | ICD-10-CM | POA: Diagnosis not present

## 2016-10-14 DIAGNOSIS — G309 Alzheimer's disease, unspecified: Secondary | ICD-10-CM | POA: Diagnosis not present

## 2016-10-14 DIAGNOSIS — K5901 Slow transit constipation: Secondary | ICD-10-CM | POA: Diagnosis not present

## 2016-10-14 DIAGNOSIS — I69051 Hemiplegia and hemiparesis following nontraumatic subarachnoid hemorrhage affecting right dominant side: Secondary | ICD-10-CM | POA: Diagnosis not present

## 2016-10-15 DIAGNOSIS — I69920 Aphasia following unspecified cerebrovascular disease: Secondary | ICD-10-CM | POA: Diagnosis not present

## 2016-10-15 DIAGNOSIS — R627 Adult failure to thrive: Secondary | ICD-10-CM | POA: Diagnosis not present

## 2016-10-15 DIAGNOSIS — K5901 Slow transit constipation: Secondary | ICD-10-CM | POA: Diagnosis not present

## 2016-10-15 DIAGNOSIS — I6990 Unspecified sequelae of unspecified cerebrovascular disease: Secondary | ICD-10-CM | POA: Diagnosis not present

## 2016-10-15 DIAGNOSIS — I69051 Hemiplegia and hemiparesis following nontraumatic subarachnoid hemorrhage affecting right dominant side: Secondary | ICD-10-CM | POA: Diagnosis not present

## 2016-10-15 DIAGNOSIS — G309 Alzheimer's disease, unspecified: Secondary | ICD-10-CM | POA: Diagnosis not present

## 2016-10-16 DIAGNOSIS — I69920 Aphasia following unspecified cerebrovascular disease: Secondary | ICD-10-CM | POA: Diagnosis not present

## 2016-10-16 DIAGNOSIS — K5901 Slow transit constipation: Secondary | ICD-10-CM | POA: Diagnosis not present

## 2016-10-16 DIAGNOSIS — G309 Alzheimer's disease, unspecified: Secondary | ICD-10-CM | POA: Diagnosis not present

## 2016-10-16 DIAGNOSIS — I69051 Hemiplegia and hemiparesis following nontraumatic subarachnoid hemorrhage affecting right dominant side: Secondary | ICD-10-CM | POA: Diagnosis not present

## 2016-10-16 DIAGNOSIS — I6990 Unspecified sequelae of unspecified cerebrovascular disease: Secondary | ICD-10-CM | POA: Diagnosis not present

## 2016-10-16 DIAGNOSIS — R627 Adult failure to thrive: Secondary | ICD-10-CM | POA: Diagnosis not present

## 2016-10-17 DIAGNOSIS — R627 Adult failure to thrive: Secondary | ICD-10-CM | POA: Diagnosis not present

## 2016-10-17 DIAGNOSIS — I6990 Unspecified sequelae of unspecified cerebrovascular disease: Secondary | ICD-10-CM | POA: Diagnosis not present

## 2016-10-17 DIAGNOSIS — I69920 Aphasia following unspecified cerebrovascular disease: Secondary | ICD-10-CM | POA: Diagnosis not present

## 2016-10-17 DIAGNOSIS — K5901 Slow transit constipation: Secondary | ICD-10-CM | POA: Diagnosis not present

## 2016-10-17 DIAGNOSIS — G309 Alzheimer's disease, unspecified: Secondary | ICD-10-CM | POA: Diagnosis not present

## 2016-10-17 DIAGNOSIS — I69051 Hemiplegia and hemiparesis following nontraumatic subarachnoid hemorrhage affecting right dominant side: Secondary | ICD-10-CM | POA: Diagnosis not present

## 2016-10-20 DIAGNOSIS — R627 Adult failure to thrive: Secondary | ICD-10-CM | POA: Diagnosis not present

## 2016-10-20 DIAGNOSIS — G309 Alzheimer's disease, unspecified: Secondary | ICD-10-CM | POA: Diagnosis not present

## 2016-10-20 DIAGNOSIS — I69051 Hemiplegia and hemiparesis following nontraumatic subarachnoid hemorrhage affecting right dominant side: Secondary | ICD-10-CM | POA: Diagnosis not present

## 2016-10-20 DIAGNOSIS — K5901 Slow transit constipation: Secondary | ICD-10-CM | POA: Diagnosis not present

## 2016-10-20 DIAGNOSIS — I69920 Aphasia following unspecified cerebrovascular disease: Secondary | ICD-10-CM | POA: Diagnosis not present

## 2016-10-20 DIAGNOSIS — I6990 Unspecified sequelae of unspecified cerebrovascular disease: Secondary | ICD-10-CM | POA: Diagnosis not present

## 2016-10-21 DIAGNOSIS — I6990 Unspecified sequelae of unspecified cerebrovascular disease: Secondary | ICD-10-CM | POA: Diagnosis not present

## 2016-10-21 DIAGNOSIS — G309 Alzheimer's disease, unspecified: Secondary | ICD-10-CM | POA: Diagnosis not present

## 2016-10-21 DIAGNOSIS — R627 Adult failure to thrive: Secondary | ICD-10-CM | POA: Diagnosis not present

## 2016-10-21 DIAGNOSIS — K5901 Slow transit constipation: Secondary | ICD-10-CM | POA: Diagnosis not present

## 2016-10-21 DIAGNOSIS — I69051 Hemiplegia and hemiparesis following nontraumatic subarachnoid hemorrhage affecting right dominant side: Secondary | ICD-10-CM | POA: Diagnosis not present

## 2016-10-21 DIAGNOSIS — I69920 Aphasia following unspecified cerebrovascular disease: Secondary | ICD-10-CM | POA: Diagnosis not present

## 2016-10-22 DIAGNOSIS — I69920 Aphasia following unspecified cerebrovascular disease: Secondary | ICD-10-CM | POA: Diagnosis not present

## 2016-10-22 DIAGNOSIS — G309 Alzheimer's disease, unspecified: Secondary | ICD-10-CM | POA: Diagnosis not present

## 2016-10-22 DIAGNOSIS — I6990 Unspecified sequelae of unspecified cerebrovascular disease: Secondary | ICD-10-CM | POA: Diagnosis not present

## 2016-10-22 DIAGNOSIS — R627 Adult failure to thrive: Secondary | ICD-10-CM | POA: Diagnosis not present

## 2016-10-22 DIAGNOSIS — K5901 Slow transit constipation: Secondary | ICD-10-CM | POA: Diagnosis not present

## 2016-10-22 DIAGNOSIS — I69051 Hemiplegia and hemiparesis following nontraumatic subarachnoid hemorrhage affecting right dominant side: Secondary | ICD-10-CM | POA: Diagnosis not present

## 2016-10-23 DIAGNOSIS — I69051 Hemiplegia and hemiparesis following nontraumatic subarachnoid hemorrhage affecting right dominant side: Secondary | ICD-10-CM | POA: Diagnosis not present

## 2016-10-23 DIAGNOSIS — K5901 Slow transit constipation: Secondary | ICD-10-CM | POA: Diagnosis not present

## 2016-10-23 DIAGNOSIS — R627 Adult failure to thrive: Secondary | ICD-10-CM | POA: Diagnosis not present

## 2016-10-23 DIAGNOSIS — I6990 Unspecified sequelae of unspecified cerebrovascular disease: Secondary | ICD-10-CM | POA: Diagnosis not present

## 2016-10-23 DIAGNOSIS — I69920 Aphasia following unspecified cerebrovascular disease: Secondary | ICD-10-CM | POA: Diagnosis not present

## 2016-10-23 DIAGNOSIS — G309 Alzheimer's disease, unspecified: Secondary | ICD-10-CM | POA: Diagnosis not present

## 2016-10-24 DIAGNOSIS — G309 Alzheimer's disease, unspecified: Secondary | ICD-10-CM | POA: Diagnosis not present

## 2016-10-24 DIAGNOSIS — R627 Adult failure to thrive: Secondary | ICD-10-CM | POA: Diagnosis not present

## 2016-10-24 DIAGNOSIS — I6990 Unspecified sequelae of unspecified cerebrovascular disease: Secondary | ICD-10-CM | POA: Diagnosis not present

## 2016-10-24 DIAGNOSIS — I69051 Hemiplegia and hemiparesis following nontraumatic subarachnoid hemorrhage affecting right dominant side: Secondary | ICD-10-CM | POA: Diagnosis not present

## 2016-10-24 DIAGNOSIS — I69920 Aphasia following unspecified cerebrovascular disease: Secondary | ICD-10-CM | POA: Diagnosis not present

## 2016-10-24 DIAGNOSIS — K5901 Slow transit constipation: Secondary | ICD-10-CM | POA: Diagnosis not present

## 2016-10-28 DIAGNOSIS — G309 Alzheimer's disease, unspecified: Secondary | ICD-10-CM | POA: Diagnosis not present

## 2016-10-28 DIAGNOSIS — I6990 Unspecified sequelae of unspecified cerebrovascular disease: Secondary | ICD-10-CM | POA: Diagnosis not present

## 2016-10-28 DIAGNOSIS — R627 Adult failure to thrive: Secondary | ICD-10-CM | POA: Diagnosis not present

## 2016-10-28 DIAGNOSIS — I69051 Hemiplegia and hemiparesis following nontraumatic subarachnoid hemorrhage affecting right dominant side: Secondary | ICD-10-CM | POA: Diagnosis not present

## 2016-10-28 DIAGNOSIS — K5901 Slow transit constipation: Secondary | ICD-10-CM | POA: Diagnosis not present

## 2016-10-28 DIAGNOSIS — I69920 Aphasia following unspecified cerebrovascular disease: Secondary | ICD-10-CM | POA: Diagnosis not present

## 2016-10-29 DIAGNOSIS — I69920 Aphasia following unspecified cerebrovascular disease: Secondary | ICD-10-CM | POA: Diagnosis not present

## 2016-10-29 DIAGNOSIS — R627 Adult failure to thrive: Secondary | ICD-10-CM | POA: Diagnosis not present

## 2016-10-29 DIAGNOSIS — I69051 Hemiplegia and hemiparesis following nontraumatic subarachnoid hemorrhage affecting right dominant side: Secondary | ICD-10-CM | POA: Diagnosis not present

## 2016-10-29 DIAGNOSIS — K5901 Slow transit constipation: Secondary | ICD-10-CM | POA: Diagnosis not present

## 2016-10-29 DIAGNOSIS — G309 Alzheimer's disease, unspecified: Secondary | ICD-10-CM | POA: Diagnosis not present

## 2016-10-29 DIAGNOSIS — I6990 Unspecified sequelae of unspecified cerebrovascular disease: Secondary | ICD-10-CM | POA: Diagnosis not present

## 2016-10-30 DIAGNOSIS — R627 Adult failure to thrive: Secondary | ICD-10-CM | POA: Diagnosis not present

## 2016-10-30 DIAGNOSIS — G309 Alzheimer's disease, unspecified: Secondary | ICD-10-CM | POA: Diagnosis not present

## 2016-10-30 DIAGNOSIS — I69051 Hemiplegia and hemiparesis following nontraumatic subarachnoid hemorrhage affecting right dominant side: Secondary | ICD-10-CM | POA: Diagnosis not present

## 2016-10-30 DIAGNOSIS — I6990 Unspecified sequelae of unspecified cerebrovascular disease: Secondary | ICD-10-CM | POA: Diagnosis not present

## 2016-10-30 DIAGNOSIS — I69920 Aphasia following unspecified cerebrovascular disease: Secondary | ICD-10-CM | POA: Diagnosis not present

## 2016-10-30 DIAGNOSIS — K5901 Slow transit constipation: Secondary | ICD-10-CM | POA: Diagnosis not present

## 2016-10-31 DIAGNOSIS — I69051 Hemiplegia and hemiparesis following nontraumatic subarachnoid hemorrhage affecting right dominant side: Secondary | ICD-10-CM | POA: Diagnosis not present

## 2016-10-31 DIAGNOSIS — K5901 Slow transit constipation: Secondary | ICD-10-CM | POA: Diagnosis not present

## 2016-10-31 DIAGNOSIS — R627 Adult failure to thrive: Secondary | ICD-10-CM | POA: Diagnosis not present

## 2016-10-31 DIAGNOSIS — F0151 Vascular dementia with behavioral disturbance: Secondary | ICD-10-CM | POA: Diagnosis not present

## 2016-10-31 DIAGNOSIS — G309 Alzheimer's disease, unspecified: Secondary | ICD-10-CM | POA: Diagnosis not present

## 2016-10-31 DIAGNOSIS — I6990 Unspecified sequelae of unspecified cerebrovascular disease: Secondary | ICD-10-CM | POA: Diagnosis not present

## 2016-10-31 DIAGNOSIS — F0281 Dementia in other diseases classified elsewhere with behavioral disturbance: Secondary | ICD-10-CM | POA: Diagnosis not present

## 2016-10-31 DIAGNOSIS — I69920 Aphasia following unspecified cerebrovascular disease: Secondary | ICD-10-CM | POA: Diagnosis not present

## 2016-11-03 DIAGNOSIS — R627 Adult failure to thrive: Secondary | ICD-10-CM | POA: Diagnosis not present

## 2016-11-03 DIAGNOSIS — G309 Alzheimer's disease, unspecified: Secondary | ICD-10-CM | POA: Diagnosis not present

## 2016-11-03 DIAGNOSIS — K5901 Slow transit constipation: Secondary | ICD-10-CM | POA: Diagnosis not present

## 2016-11-03 DIAGNOSIS — I69051 Hemiplegia and hemiparesis following nontraumatic subarachnoid hemorrhage affecting right dominant side: Secondary | ICD-10-CM | POA: Diagnosis not present

## 2016-11-03 DIAGNOSIS — I6990 Unspecified sequelae of unspecified cerebrovascular disease: Secondary | ICD-10-CM | POA: Diagnosis not present

## 2016-11-03 DIAGNOSIS — I69920 Aphasia following unspecified cerebrovascular disease: Secondary | ICD-10-CM | POA: Diagnosis not present

## 2016-11-04 DIAGNOSIS — K5901 Slow transit constipation: Secondary | ICD-10-CM | POA: Diagnosis not present

## 2016-11-04 DIAGNOSIS — I69051 Hemiplegia and hemiparesis following nontraumatic subarachnoid hemorrhage affecting right dominant side: Secondary | ICD-10-CM | POA: Diagnosis not present

## 2016-11-04 DIAGNOSIS — R627 Adult failure to thrive: Secondary | ICD-10-CM | POA: Diagnosis not present

## 2016-11-04 DIAGNOSIS — I69920 Aphasia following unspecified cerebrovascular disease: Secondary | ICD-10-CM | POA: Diagnosis not present

## 2016-11-04 DIAGNOSIS — I6990 Unspecified sequelae of unspecified cerebrovascular disease: Secondary | ICD-10-CM | POA: Diagnosis not present

## 2016-11-04 DIAGNOSIS — G309 Alzheimer's disease, unspecified: Secondary | ICD-10-CM | POA: Diagnosis not present

## 2016-11-05 DIAGNOSIS — G309 Alzheimer's disease, unspecified: Secondary | ICD-10-CM | POA: Diagnosis not present

## 2016-11-05 DIAGNOSIS — R627 Adult failure to thrive: Secondary | ICD-10-CM | POA: Diagnosis not present

## 2016-11-05 DIAGNOSIS — K5901 Slow transit constipation: Secondary | ICD-10-CM | POA: Diagnosis not present

## 2016-11-05 DIAGNOSIS — I6990 Unspecified sequelae of unspecified cerebrovascular disease: Secondary | ICD-10-CM | POA: Diagnosis not present

## 2016-11-05 DIAGNOSIS — I69051 Hemiplegia and hemiparesis following nontraumatic subarachnoid hemorrhage affecting right dominant side: Secondary | ICD-10-CM | POA: Diagnosis not present

## 2016-11-05 DIAGNOSIS — I69920 Aphasia following unspecified cerebrovascular disease: Secondary | ICD-10-CM | POA: Diagnosis not present

## 2016-11-06 DIAGNOSIS — R627 Adult failure to thrive: Secondary | ICD-10-CM | POA: Diagnosis not present

## 2016-11-06 DIAGNOSIS — G309 Alzheimer's disease, unspecified: Secondary | ICD-10-CM | POA: Diagnosis not present

## 2016-11-06 DIAGNOSIS — I69920 Aphasia following unspecified cerebrovascular disease: Secondary | ICD-10-CM | POA: Diagnosis not present

## 2016-11-06 DIAGNOSIS — I69051 Hemiplegia and hemiparesis following nontraumatic subarachnoid hemorrhage affecting right dominant side: Secondary | ICD-10-CM | POA: Diagnosis not present

## 2016-11-06 DIAGNOSIS — K5901 Slow transit constipation: Secondary | ICD-10-CM | POA: Diagnosis not present

## 2016-11-06 DIAGNOSIS — I6990 Unspecified sequelae of unspecified cerebrovascular disease: Secondary | ICD-10-CM | POA: Diagnosis not present

## 2016-11-07 DIAGNOSIS — I69051 Hemiplegia and hemiparesis following nontraumatic subarachnoid hemorrhage affecting right dominant side: Secondary | ICD-10-CM | POA: Diagnosis not present

## 2016-11-07 DIAGNOSIS — G309 Alzheimer's disease, unspecified: Secondary | ICD-10-CM | POA: Diagnosis not present

## 2016-11-07 DIAGNOSIS — R627 Adult failure to thrive: Secondary | ICD-10-CM | POA: Diagnosis not present

## 2016-11-07 DIAGNOSIS — K5901 Slow transit constipation: Secondary | ICD-10-CM | POA: Diagnosis not present

## 2016-11-07 DIAGNOSIS — I6990 Unspecified sequelae of unspecified cerebrovascular disease: Secondary | ICD-10-CM | POA: Diagnosis not present

## 2016-11-07 DIAGNOSIS — I69920 Aphasia following unspecified cerebrovascular disease: Secondary | ICD-10-CM | POA: Diagnosis not present

## 2016-11-10 DIAGNOSIS — K5901 Slow transit constipation: Secondary | ICD-10-CM | POA: Diagnosis not present

## 2016-11-10 DIAGNOSIS — G309 Alzheimer's disease, unspecified: Secondary | ICD-10-CM | POA: Diagnosis not present

## 2016-11-10 DIAGNOSIS — I69051 Hemiplegia and hemiparesis following nontraumatic subarachnoid hemorrhage affecting right dominant side: Secondary | ICD-10-CM | POA: Diagnosis not present

## 2016-11-10 DIAGNOSIS — I69920 Aphasia following unspecified cerebrovascular disease: Secondary | ICD-10-CM | POA: Diagnosis not present

## 2016-11-10 DIAGNOSIS — R627 Adult failure to thrive: Secondary | ICD-10-CM | POA: Diagnosis not present

## 2016-11-10 DIAGNOSIS — I6990 Unspecified sequelae of unspecified cerebrovascular disease: Secondary | ICD-10-CM | POA: Diagnosis not present

## 2016-11-11 DIAGNOSIS — I6990 Unspecified sequelae of unspecified cerebrovascular disease: Secondary | ICD-10-CM | POA: Diagnosis not present

## 2016-11-11 DIAGNOSIS — K5901 Slow transit constipation: Secondary | ICD-10-CM | POA: Diagnosis not present

## 2016-11-11 DIAGNOSIS — I69920 Aphasia following unspecified cerebrovascular disease: Secondary | ICD-10-CM | POA: Diagnosis not present

## 2016-11-11 DIAGNOSIS — G309 Alzheimer's disease, unspecified: Secondary | ICD-10-CM | POA: Diagnosis not present

## 2016-11-11 DIAGNOSIS — R627 Adult failure to thrive: Secondary | ICD-10-CM | POA: Diagnosis not present

## 2016-11-11 DIAGNOSIS — I69051 Hemiplegia and hemiparesis following nontraumatic subarachnoid hemorrhage affecting right dominant side: Secondary | ICD-10-CM | POA: Diagnosis not present

## 2016-11-12 DIAGNOSIS — K5901 Slow transit constipation: Secondary | ICD-10-CM | POA: Diagnosis not present

## 2016-11-12 DIAGNOSIS — I6990 Unspecified sequelae of unspecified cerebrovascular disease: Secondary | ICD-10-CM | POA: Diagnosis not present

## 2016-11-12 DIAGNOSIS — I69051 Hemiplegia and hemiparesis following nontraumatic subarachnoid hemorrhage affecting right dominant side: Secondary | ICD-10-CM | POA: Diagnosis not present

## 2016-11-12 DIAGNOSIS — R627 Adult failure to thrive: Secondary | ICD-10-CM | POA: Diagnosis not present

## 2016-11-12 DIAGNOSIS — I69920 Aphasia following unspecified cerebrovascular disease: Secondary | ICD-10-CM | POA: Diagnosis not present

## 2016-11-12 DIAGNOSIS — G309 Alzheimer's disease, unspecified: Secondary | ICD-10-CM | POA: Diagnosis not present

## 2016-11-13 ENCOUNTER — Ambulatory Visit: Payer: Medicare Other | Admitting: Internal Medicine

## 2016-11-13 DIAGNOSIS — I6990 Unspecified sequelae of unspecified cerebrovascular disease: Secondary | ICD-10-CM | POA: Diagnosis not present

## 2016-11-13 DIAGNOSIS — I69051 Hemiplegia and hemiparesis following nontraumatic subarachnoid hemorrhage affecting right dominant side: Secondary | ICD-10-CM | POA: Diagnosis not present

## 2016-11-13 DIAGNOSIS — K5901 Slow transit constipation: Secondary | ICD-10-CM | POA: Diagnosis not present

## 2016-11-13 DIAGNOSIS — I69920 Aphasia following unspecified cerebrovascular disease: Secondary | ICD-10-CM | POA: Diagnosis not present

## 2016-11-13 DIAGNOSIS — G309 Alzheimer's disease, unspecified: Secondary | ICD-10-CM | POA: Diagnosis not present

## 2016-11-13 DIAGNOSIS — R627 Adult failure to thrive: Secondary | ICD-10-CM | POA: Diagnosis not present

## 2016-11-14 DIAGNOSIS — I69920 Aphasia following unspecified cerebrovascular disease: Secondary | ICD-10-CM | POA: Diagnosis not present

## 2016-11-14 DIAGNOSIS — I69051 Hemiplegia and hemiparesis following nontraumatic subarachnoid hemorrhage affecting right dominant side: Secondary | ICD-10-CM | POA: Diagnosis not present

## 2016-11-14 DIAGNOSIS — I6990 Unspecified sequelae of unspecified cerebrovascular disease: Secondary | ICD-10-CM | POA: Diagnosis not present

## 2016-11-14 DIAGNOSIS — G309 Alzheimer's disease, unspecified: Secondary | ICD-10-CM | POA: Diagnosis not present

## 2016-11-14 DIAGNOSIS — R627 Adult failure to thrive: Secondary | ICD-10-CM | POA: Diagnosis not present

## 2016-11-14 DIAGNOSIS — K5901 Slow transit constipation: Secondary | ICD-10-CM | POA: Diagnosis not present

## 2016-11-17 DIAGNOSIS — R627 Adult failure to thrive: Secondary | ICD-10-CM | POA: Diagnosis not present

## 2016-11-17 DIAGNOSIS — I69920 Aphasia following unspecified cerebrovascular disease: Secondary | ICD-10-CM | POA: Diagnosis not present

## 2016-11-17 DIAGNOSIS — I69051 Hemiplegia and hemiparesis following nontraumatic subarachnoid hemorrhage affecting right dominant side: Secondary | ICD-10-CM | POA: Diagnosis not present

## 2016-11-17 DIAGNOSIS — G309 Alzheimer's disease, unspecified: Secondary | ICD-10-CM | POA: Diagnosis not present

## 2016-11-17 DIAGNOSIS — I6990 Unspecified sequelae of unspecified cerebrovascular disease: Secondary | ICD-10-CM | POA: Diagnosis not present

## 2016-11-17 DIAGNOSIS — K5901 Slow transit constipation: Secondary | ICD-10-CM | POA: Diagnosis not present

## 2016-11-18 DIAGNOSIS — I69051 Hemiplegia and hemiparesis following nontraumatic subarachnoid hemorrhage affecting right dominant side: Secondary | ICD-10-CM | POA: Diagnosis not present

## 2016-11-18 DIAGNOSIS — R627 Adult failure to thrive: Secondary | ICD-10-CM | POA: Diagnosis not present

## 2016-11-18 DIAGNOSIS — K5901 Slow transit constipation: Secondary | ICD-10-CM | POA: Diagnosis not present

## 2016-11-18 DIAGNOSIS — I69920 Aphasia following unspecified cerebrovascular disease: Secondary | ICD-10-CM | POA: Diagnosis not present

## 2016-11-18 DIAGNOSIS — I6990 Unspecified sequelae of unspecified cerebrovascular disease: Secondary | ICD-10-CM | POA: Diagnosis not present

## 2016-11-18 DIAGNOSIS — G309 Alzheimer's disease, unspecified: Secondary | ICD-10-CM | POA: Diagnosis not present

## 2016-11-19 DIAGNOSIS — K5901 Slow transit constipation: Secondary | ICD-10-CM | POA: Diagnosis not present

## 2016-11-19 DIAGNOSIS — I69051 Hemiplegia and hemiparesis following nontraumatic subarachnoid hemorrhage affecting right dominant side: Secondary | ICD-10-CM | POA: Diagnosis not present

## 2016-11-19 DIAGNOSIS — G309 Alzheimer's disease, unspecified: Secondary | ICD-10-CM | POA: Diagnosis not present

## 2016-11-19 DIAGNOSIS — I69920 Aphasia following unspecified cerebrovascular disease: Secondary | ICD-10-CM | POA: Diagnosis not present

## 2016-11-19 DIAGNOSIS — R627 Adult failure to thrive: Secondary | ICD-10-CM | POA: Diagnosis not present

## 2016-11-19 DIAGNOSIS — I6990 Unspecified sequelae of unspecified cerebrovascular disease: Secondary | ICD-10-CM | POA: Diagnosis not present

## 2016-11-20 DIAGNOSIS — G309 Alzheimer's disease, unspecified: Secondary | ICD-10-CM | POA: Diagnosis not present

## 2016-11-20 DIAGNOSIS — I6990 Unspecified sequelae of unspecified cerebrovascular disease: Secondary | ICD-10-CM | POA: Diagnosis not present

## 2016-11-20 DIAGNOSIS — K5901 Slow transit constipation: Secondary | ICD-10-CM | POA: Diagnosis not present

## 2016-11-20 DIAGNOSIS — R627 Adult failure to thrive: Secondary | ICD-10-CM | POA: Diagnosis not present

## 2016-11-20 DIAGNOSIS — I69051 Hemiplegia and hemiparesis following nontraumatic subarachnoid hemorrhage affecting right dominant side: Secondary | ICD-10-CM | POA: Diagnosis not present

## 2016-11-20 DIAGNOSIS — I69920 Aphasia following unspecified cerebrovascular disease: Secondary | ICD-10-CM | POA: Diagnosis not present

## 2016-11-21 ENCOUNTER — Telehealth: Payer: Self-pay | Admitting: *Deleted

## 2016-11-21 ENCOUNTER — Other Ambulatory Visit: Payer: Self-pay | Admitting: Nurse Practitioner

## 2016-11-21 DIAGNOSIS — R627 Adult failure to thrive: Secondary | ICD-10-CM | POA: Diagnosis not present

## 2016-11-21 DIAGNOSIS — I6990 Unspecified sequelae of unspecified cerebrovascular disease: Secondary | ICD-10-CM | POA: Diagnosis not present

## 2016-11-21 DIAGNOSIS — G309 Alzheimer's disease, unspecified: Secondary | ICD-10-CM | POA: Diagnosis not present

## 2016-11-21 DIAGNOSIS — K5901 Slow transit constipation: Secondary | ICD-10-CM | POA: Diagnosis not present

## 2016-11-21 DIAGNOSIS — I69051 Hemiplegia and hemiparesis following nontraumatic subarachnoid hemorrhage affecting right dominant side: Secondary | ICD-10-CM | POA: Diagnosis not present

## 2016-11-21 DIAGNOSIS — I69920 Aphasia following unspecified cerebrovascular disease: Secondary | ICD-10-CM | POA: Diagnosis not present

## 2016-11-21 MED ORDER — DOXYCYCLINE HYCLATE 100 MG PO TABS: 100.0000 mg | ORAL_TABLET | Freq: Two times a day (BID) | ORAL | 0 refills | Status: AC

## 2016-11-21 NOTE — Telephone Encounter (Signed)
Susanne with Hospice called and stated that she is a Psychologist, clinicalCertified Wound Nurse and Patient has a significant wound on her Right Trochanter with gray purulent drainage with foul smell. Patient is declining. Wonders if an antibiotic can be given. Patient does complain about leg and hip pain and takes Morphine. Please Advise.

## 2016-11-21 NOTE — Telephone Encounter (Signed)
Spoke with wound nurse and we will treat with doxycycline 100 mg by mouth twice daily for 7 days. She will monitor for negative effects of antibiotics and if adverse effects occur without benefit to wound will notify.  Also could use probiotic twice daily while on antibiotics to help GI tract

## 2016-11-24 DIAGNOSIS — G309 Alzheimer's disease, unspecified: Secondary | ICD-10-CM | POA: Diagnosis not present

## 2016-11-24 DIAGNOSIS — I69920 Aphasia following unspecified cerebrovascular disease: Secondary | ICD-10-CM | POA: Diagnosis not present

## 2016-11-24 DIAGNOSIS — K5901 Slow transit constipation: Secondary | ICD-10-CM | POA: Diagnosis not present

## 2016-11-24 DIAGNOSIS — I69051 Hemiplegia and hemiparesis following nontraumatic subarachnoid hemorrhage affecting right dominant side: Secondary | ICD-10-CM | POA: Diagnosis not present

## 2016-11-24 DIAGNOSIS — I6990 Unspecified sequelae of unspecified cerebrovascular disease: Secondary | ICD-10-CM | POA: Diagnosis not present

## 2016-11-24 DIAGNOSIS — R627 Adult failure to thrive: Secondary | ICD-10-CM | POA: Diagnosis not present

## 2016-11-25 DIAGNOSIS — G309 Alzheimer's disease, unspecified: Secondary | ICD-10-CM | POA: Diagnosis not present

## 2016-11-25 DIAGNOSIS — I69051 Hemiplegia and hemiparesis following nontraumatic subarachnoid hemorrhage affecting right dominant side: Secondary | ICD-10-CM | POA: Diagnosis not present

## 2016-11-25 DIAGNOSIS — I6990 Unspecified sequelae of unspecified cerebrovascular disease: Secondary | ICD-10-CM | POA: Diagnosis not present

## 2016-11-25 DIAGNOSIS — I69920 Aphasia following unspecified cerebrovascular disease: Secondary | ICD-10-CM | POA: Diagnosis not present

## 2016-11-25 DIAGNOSIS — K5901 Slow transit constipation: Secondary | ICD-10-CM | POA: Diagnosis not present

## 2016-11-25 DIAGNOSIS — R627 Adult failure to thrive: Secondary | ICD-10-CM | POA: Diagnosis not present

## 2016-11-26 DIAGNOSIS — I6990 Unspecified sequelae of unspecified cerebrovascular disease: Secondary | ICD-10-CM | POA: Diagnosis not present

## 2016-11-26 DIAGNOSIS — I69920 Aphasia following unspecified cerebrovascular disease: Secondary | ICD-10-CM | POA: Diagnosis not present

## 2016-11-26 DIAGNOSIS — K5901 Slow transit constipation: Secondary | ICD-10-CM | POA: Diagnosis not present

## 2016-11-26 DIAGNOSIS — I69051 Hemiplegia and hemiparesis following nontraumatic subarachnoid hemorrhage affecting right dominant side: Secondary | ICD-10-CM | POA: Diagnosis not present

## 2016-11-26 DIAGNOSIS — G309 Alzheimer's disease, unspecified: Secondary | ICD-10-CM | POA: Diagnosis not present

## 2016-11-26 DIAGNOSIS — R627 Adult failure to thrive: Secondary | ICD-10-CM | POA: Diagnosis not present

## 2016-11-27 DIAGNOSIS — R627 Adult failure to thrive: Secondary | ICD-10-CM | POA: Diagnosis not present

## 2016-11-27 DIAGNOSIS — I69051 Hemiplegia and hemiparesis following nontraumatic subarachnoid hemorrhage affecting right dominant side: Secondary | ICD-10-CM | POA: Diagnosis not present

## 2016-11-27 DIAGNOSIS — G309 Alzheimer's disease, unspecified: Secondary | ICD-10-CM | POA: Diagnosis not present

## 2016-11-27 DIAGNOSIS — I69920 Aphasia following unspecified cerebrovascular disease: Secondary | ICD-10-CM | POA: Diagnosis not present

## 2016-11-27 DIAGNOSIS — K5901 Slow transit constipation: Secondary | ICD-10-CM | POA: Diagnosis not present

## 2016-11-27 DIAGNOSIS — I6990 Unspecified sequelae of unspecified cerebrovascular disease: Secondary | ICD-10-CM | POA: Diagnosis not present

## 2016-11-28 DIAGNOSIS — I6990 Unspecified sequelae of unspecified cerebrovascular disease: Secondary | ICD-10-CM | POA: Diagnosis not present

## 2016-11-28 DIAGNOSIS — I69051 Hemiplegia and hemiparesis following nontraumatic subarachnoid hemorrhage affecting right dominant side: Secondary | ICD-10-CM | POA: Diagnosis not present

## 2016-11-28 DIAGNOSIS — I69920 Aphasia following unspecified cerebrovascular disease: Secondary | ICD-10-CM | POA: Diagnosis not present

## 2016-11-28 DIAGNOSIS — R627 Adult failure to thrive: Secondary | ICD-10-CM | POA: Diagnosis not present

## 2016-11-28 DIAGNOSIS — K5901 Slow transit constipation: Secondary | ICD-10-CM | POA: Diagnosis not present

## 2016-11-28 DIAGNOSIS — G309 Alzheimer's disease, unspecified: Secondary | ICD-10-CM | POA: Diagnosis not present

## 2016-11-29 DIAGNOSIS — I69051 Hemiplegia and hemiparesis following nontraumatic subarachnoid hemorrhage affecting right dominant side: Secondary | ICD-10-CM | POA: Diagnosis not present

## 2016-11-29 DIAGNOSIS — K5901 Slow transit constipation: Secondary | ICD-10-CM | POA: Diagnosis not present

## 2016-11-29 DIAGNOSIS — I6990 Unspecified sequelae of unspecified cerebrovascular disease: Secondary | ICD-10-CM | POA: Diagnosis not present

## 2016-11-29 DIAGNOSIS — G309 Alzheimer's disease, unspecified: Secondary | ICD-10-CM | POA: Diagnosis not present

## 2016-11-29 DIAGNOSIS — I69920 Aphasia following unspecified cerebrovascular disease: Secondary | ICD-10-CM | POA: Diagnosis not present

## 2016-11-29 DIAGNOSIS — R627 Adult failure to thrive: Secondary | ICD-10-CM | POA: Diagnosis not present

## 2016-11-30 DIAGNOSIS — I6990 Unspecified sequelae of unspecified cerebrovascular disease: Secondary | ICD-10-CM | POA: Diagnosis not present

## 2016-11-30 DIAGNOSIS — K5901 Slow transit constipation: Secondary | ICD-10-CM | POA: Diagnosis not present

## 2016-11-30 DIAGNOSIS — F0281 Dementia in other diseases classified elsewhere with behavioral disturbance: Secondary | ICD-10-CM | POA: Diagnosis not present

## 2016-11-30 DIAGNOSIS — G309 Alzheimer's disease, unspecified: Secondary | ICD-10-CM | POA: Diagnosis not present

## 2016-11-30 DIAGNOSIS — I69920 Aphasia following unspecified cerebrovascular disease: Secondary | ICD-10-CM | POA: Diagnosis not present

## 2016-11-30 DIAGNOSIS — F0151 Vascular dementia with behavioral disturbance: Secondary | ICD-10-CM | POA: Diagnosis not present

## 2016-11-30 DIAGNOSIS — I69051 Hemiplegia and hemiparesis following nontraumatic subarachnoid hemorrhage affecting right dominant side: Secondary | ICD-10-CM | POA: Diagnosis not present

## 2016-11-30 DIAGNOSIS — R627 Adult failure to thrive: Secondary | ICD-10-CM | POA: Diagnosis not present

## 2016-12-01 DIAGNOSIS — I69920 Aphasia following unspecified cerebrovascular disease: Secondary | ICD-10-CM | POA: Diagnosis not present

## 2016-12-01 DIAGNOSIS — G309 Alzheimer's disease, unspecified: Secondary | ICD-10-CM | POA: Diagnosis not present

## 2016-12-01 DIAGNOSIS — R627 Adult failure to thrive: Secondary | ICD-10-CM | POA: Diagnosis not present

## 2016-12-01 DIAGNOSIS — K5901 Slow transit constipation: Secondary | ICD-10-CM | POA: Diagnosis not present

## 2016-12-01 DIAGNOSIS — I69051 Hemiplegia and hemiparesis following nontraumatic subarachnoid hemorrhage affecting right dominant side: Secondary | ICD-10-CM | POA: Diagnosis not present

## 2016-12-01 DIAGNOSIS — I6990 Unspecified sequelae of unspecified cerebrovascular disease: Secondary | ICD-10-CM | POA: Diagnosis not present

## 2016-12-02 DIAGNOSIS — I69920 Aphasia following unspecified cerebrovascular disease: Secondary | ICD-10-CM | POA: Diagnosis not present

## 2016-12-02 DIAGNOSIS — I69051 Hemiplegia and hemiparesis following nontraumatic subarachnoid hemorrhage affecting right dominant side: Secondary | ICD-10-CM | POA: Diagnosis not present

## 2016-12-02 DIAGNOSIS — I6990 Unspecified sequelae of unspecified cerebrovascular disease: Secondary | ICD-10-CM | POA: Diagnosis not present

## 2016-12-02 DIAGNOSIS — R627 Adult failure to thrive: Secondary | ICD-10-CM | POA: Diagnosis not present

## 2016-12-02 DIAGNOSIS — G309 Alzheimer's disease, unspecified: Secondary | ICD-10-CM | POA: Diagnosis not present

## 2016-12-02 DIAGNOSIS — K5901 Slow transit constipation: Secondary | ICD-10-CM | POA: Diagnosis not present

## 2016-12-31 DEATH — deceased

## 2017-02-03 IMAGING — CR DG SHOULDER 2+V*R*
2 series · 2 of 2 positions shown · non-contrast
Comparison: None.

CLINICAL DATA: Slip and fall injury at home.

EXAM:
RIGHT SHOULDER - 2+ VIEW

[x shoulder ap right (1 of 2)]
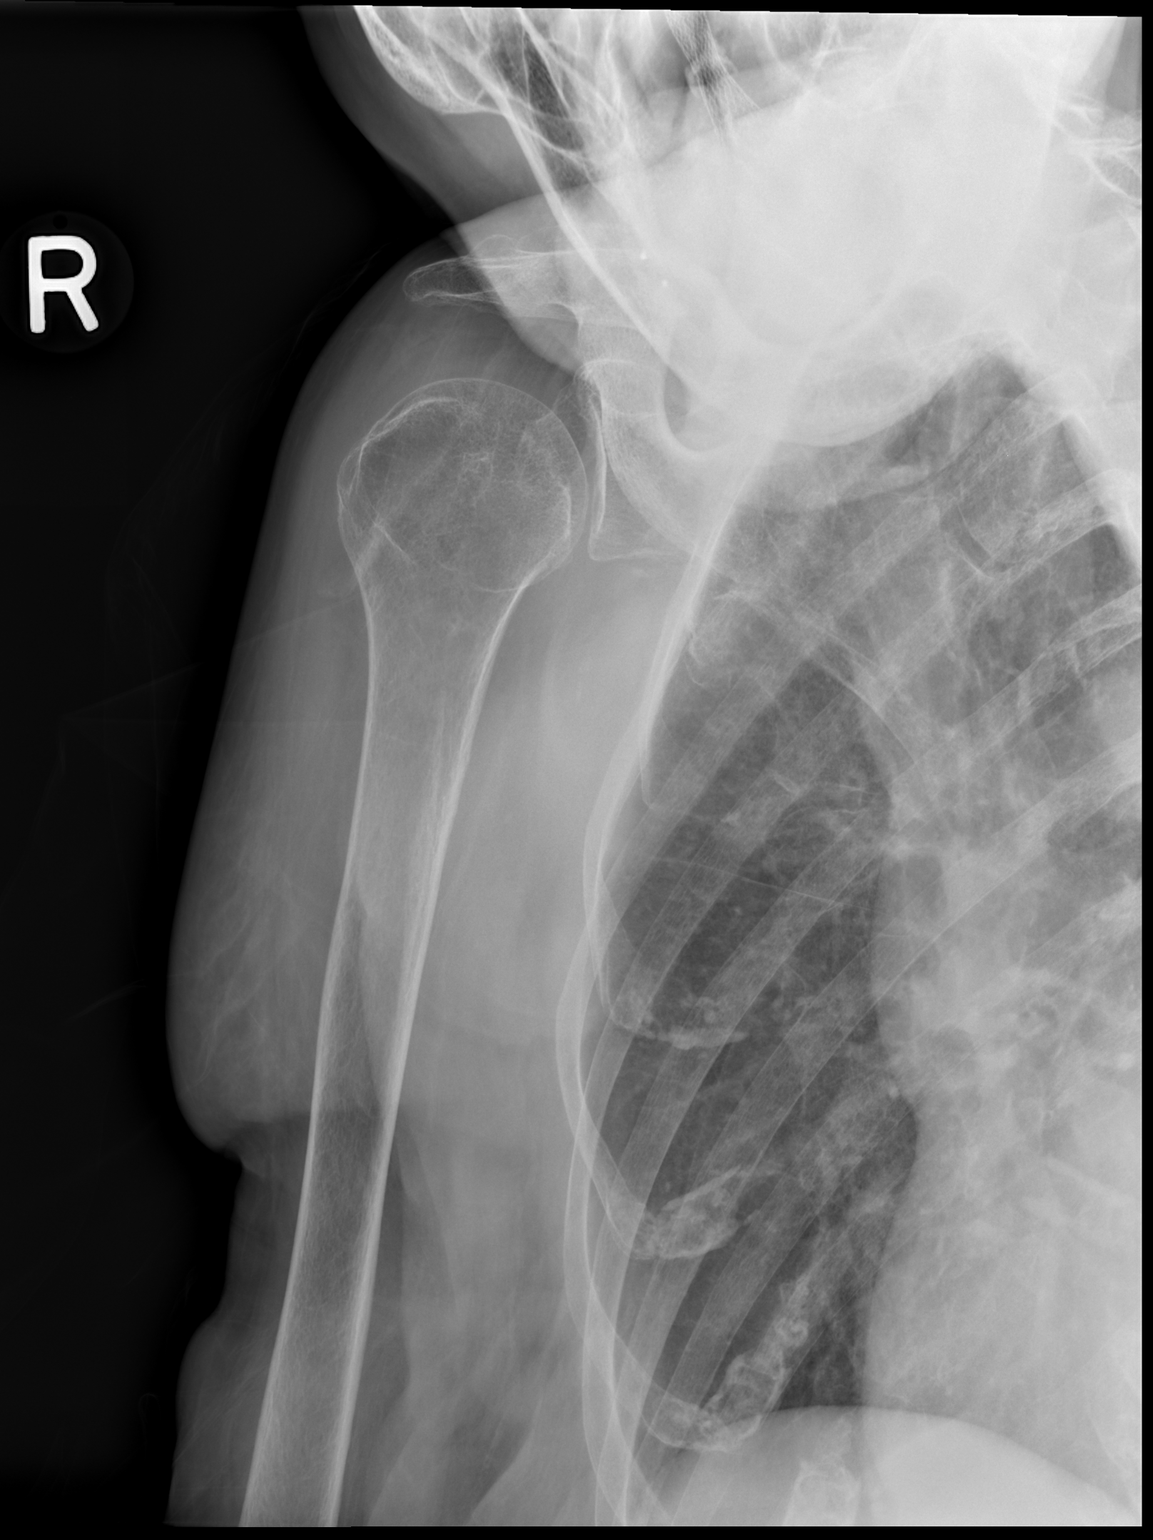

[x shoulder ap right (2 of 2)]
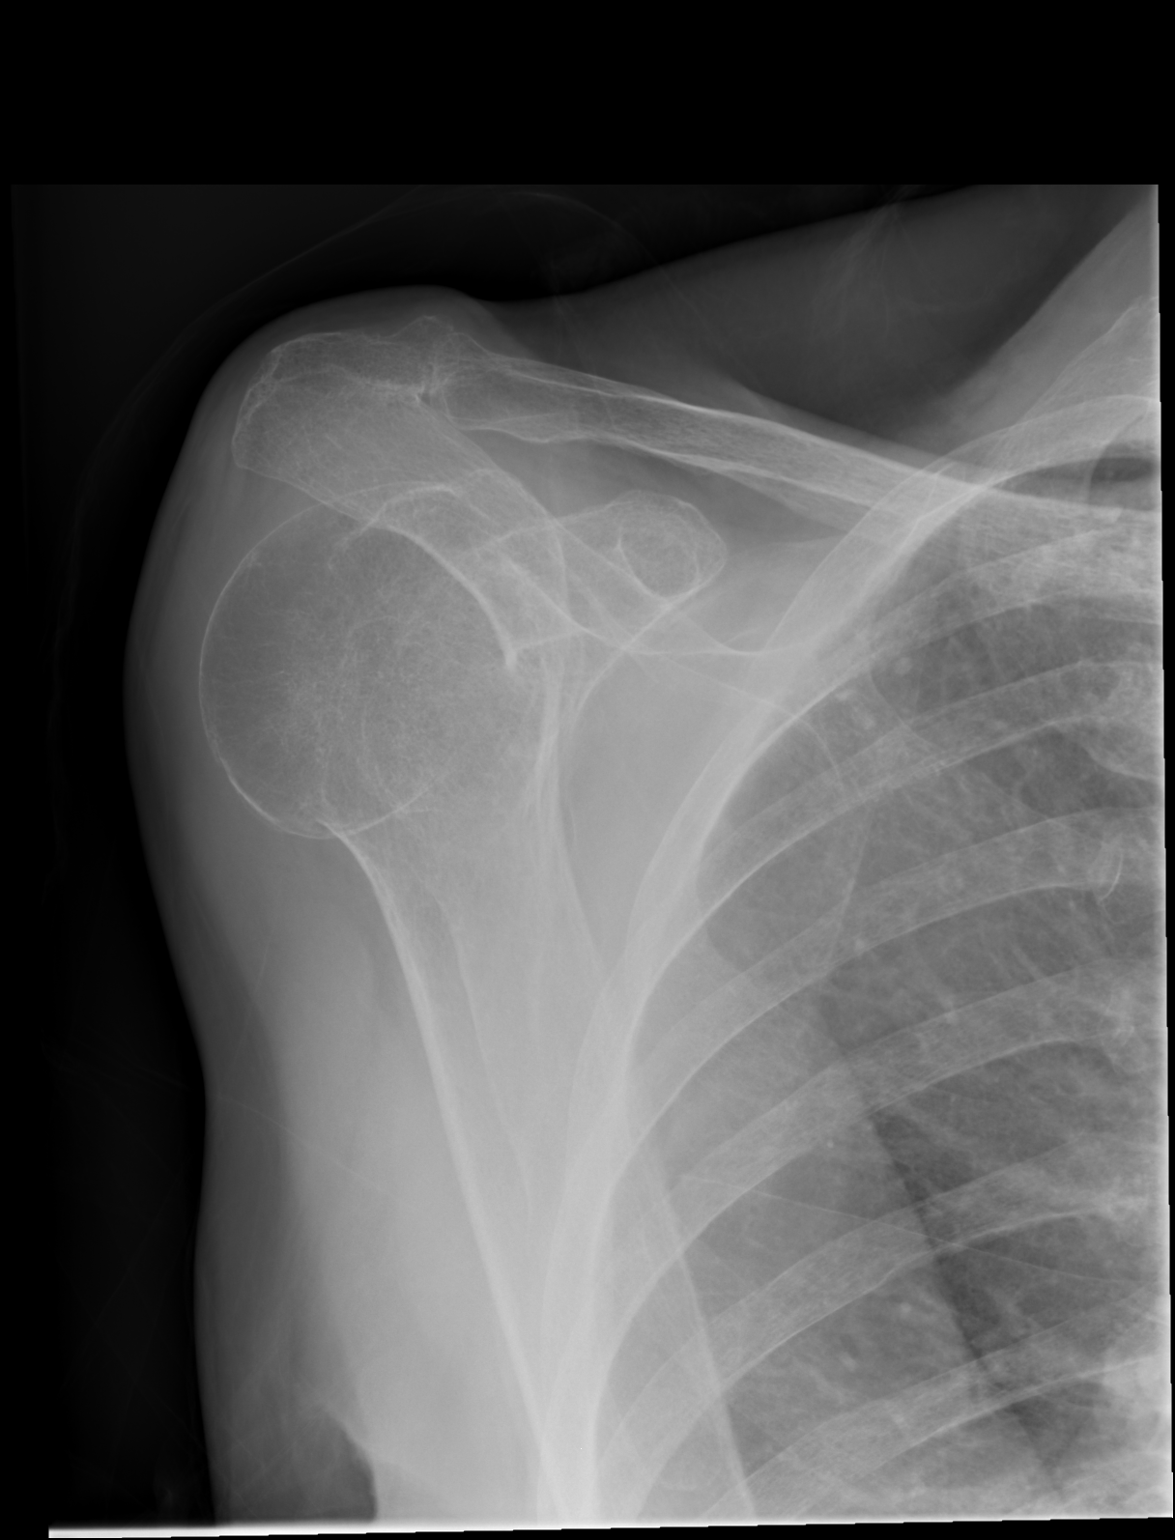

[2 of 2 positions shown; findings below may reference images not displayed]

FINDINGS: There is no evidence of fracture or dislocation. There is no
evidence of arthropathy or other focal bone abnormality. Soft
tissues are unremarkable.
IMPRESSION: Negative.
